# Patient Record
Sex: Female | Born: 2000 | Race: Black or African American | Hispanic: No | Marital: Single | Smoking: Former smoker
Health system: Southern US, Community
[De-identification: ages and names within clinical notes are randomized; demographics above are authoritative.]

## PROBLEM LIST (undated history)

## (undated) DIAGNOSIS — L309 Dermatitis, unspecified: Secondary | ICD-10-CM

## (undated) DIAGNOSIS — J45909 Unspecified asthma, uncomplicated: Secondary | ICD-10-CM

## (undated) DIAGNOSIS — Z8619 Personal history of other infectious and parasitic diseases: Secondary | ICD-10-CM

## (undated) HISTORY — PX: WISDOM TOOTH EXTRACTION: SHX21

## (undated) HISTORY — PX: NO PAST SURGERIES: SHX2092

---
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
## 2020-01-14 ENCOUNTER — Other Ambulatory Visit: Payer: Self-pay

## 2020-01-14 ENCOUNTER — Ambulatory Visit (HOSPITAL_COMMUNITY)
Admission: EM | Admit: 2020-01-14 | Discharge: 2020-01-14 | Disposition: A | Discharge status: Home / Self Care | Payer: Medicaid Other | Attending: Family Medicine | Admitting: Family Medicine

## 2020-01-14 DIAGNOSIS — N760 Acute vaginitis: Secondary | ICD-10-CM | POA: Diagnosis not present

## 2020-01-14 DIAGNOSIS — L309 Dermatitis, unspecified: Secondary | ICD-10-CM | POA: Insufficient documentation

## 2020-01-14 DIAGNOSIS — L209 Atopic dermatitis, unspecified: Secondary | ICD-10-CM | POA: Insufficient documentation

## 2020-01-14 LAB — POCT URINALYSIS DIP (DEVICE)
Bilirubin Urine: NEGATIVE
Glucose, UA: NEGATIVE mg/dL
Hgb urine dipstick: NEGATIVE
Ketones, ur: NEGATIVE mg/dL
Leukocytes,Ua: NEGATIVE
Nitrite: NEGATIVE
Protein, ur: NEGATIVE mg/dL
Specific Gravity, Urine: >=1.03 (ref 1.005–1.030)
Urobilinogen, UA: 0.2 mg/dL (ref 0.0–1.0)
pH: 5.5 (ref 5.0–8.0)

## 2020-01-14 MED ORDER — FLUTICASONE PROPIONATE 0.05 % EX CREA: TOPICAL_CREAM | Freq: Two times a day (BID) | CUTANEOUS | 4 refills | Status: DC

## 2020-01-14 MED ORDER — FLUCONAZOLE 150 MG PO TABS
150.0000 mg | ORAL_TABLET | Freq: Every day | ORAL | 0 refills | Status: DC
Start: 2020-01-14 — End: 2020-12-06

## 2020-01-14 MED ORDER — METRONIDAZOLE 500 MG PO TABS: 500.0000 mg | ORAL_TABLET | Freq: Two times a day (BID) | ORAL | 0 refills | Status: DC

## 2020-01-14 MED ORDER — NYSTATIN-TRIAMCINOLONE 100000-0.1 UNIT/GM-% EX CREA
TOPICAL_CREAM | CUTANEOUS | 0 refills | Status: DC
Start: 2020-01-14 — End: 2020-12-06

## 2020-01-14 NOTE — Discharge Instructions (Addendum)
Treating you for bacterial and yeast infection.  Use the medication as prescribed. Also sent in some cream for your  eczema.  Make sure you are keeping the skin moisturized with emollients over-the-counter You can check your MyChart for your swab results

## 2020-01-14 NOTE — ED Triage Notes (Signed)
Pt presents after using new vaginal soap "honey pot" and now has sores discomfort on labias. Pt denies having reaction to soaps in past. Pt states she tried vaseline without relief.

## 2020-01-14 NOTE — ED Provider Notes (Signed)
[facility]    CSN: [id number] Arrival date & time: 01/14/20  1051      History   Chief Complaint Chief Complaint  Patient presents with  . Allergic Reaction    HPI Gabrielle Garcia is a 19 y.o. female.   Patient is a 19 year old female with past medical history of eczema, asthma.  She presents today for vaginal irritation, swelling and rash.  Symptoms been constant since using a new over-the-counter.  Reporting sores on labia.  She did do baking soda bath which helped her symptoms.  History of BV and yeast.  No abdominal pain, fevers.  Some dysuria due to the irritation.  Some milky white discharge without odor.  No specific itching.  ROS per HPI      Past Medical History:  Diagnosis Date  . Asthma   . Eczema     Patient Active Problem List   Diagnosis Date Noted  . Dry skin 07/21/2019  . Enlarged lymph node 07/21/2019  . Irregular menstruation 04/21/2019  . Seasonal allergic conjunctivitis 02/14/2016  . Asthma, moderate persistent 03/14/2014  . Allergic rhinitis 10/12/2013  . Atopic dermatitis 12/20/2012    Past Surgical History:  Procedure Laterality Date  . NO PAST SURGERIES      OB History    Gravida  0   Para  0   Term  0   Preterm  0   AB  0   Living  0     SAB  0   TAB  0   Ectopic  0   Multiple  0   Live Births  0            Home Medications    Prior to Admission medications   Medication Sig Start Date End Date Taking? Authorizing Provider  RESTASIS 0.05 % ophthalmic emulsion Place 1 drop into both eyes daily.  12/03/17  Yes [provider]  albuterol (VENTOLIN HFA) 108 (90 Base) MCG/ACT inhaler Inhale 2 puffs into the lungs every 4 (four) hours as needed for wheezing or shortness of breath (or cough). 04/18/19 01/14/20 Yes Simha, Shruti V, MD  fluconazole (DIFLUCAN) 150 MG tablet Take 1 tablet (150 mg total) by mouth daily. 01/14/20   Juston Goheen, Tressia Miners A, NP  fluticasone (CUTIVATE) 0.05 % cream Apply  topically 2 (two) times daily. 01/14/20   Loura Halt A, NP  metroNIDAZOLE (FLAGYL) 500 MG tablet Take 1 tablet (500 mg total) by mouth 2 (two) times daily. 01/14/20   Orvan July, NP  nystatin-triamcinolone (MYCOLOG II) cream Apply to affected area daily 01/14/20   Loura Halt A, NP  sodium chloride (OCEAN) 0.65 % SOLN nasal spray Place 1 spray into both nostrils as needed for up to 30 days for congestion. 09/13/18 10/13/18  Alveria Apley, PA-C  cetirizine (ZYRTEC) 10 MG tablet Take 1 tablet (10 mg total) by mouth daily. 04/18/19 01/14/20  Ok Edwards, MD  norgestimate-ethinyl estradiol (ORTHO-CYCLEN,SPRINTEC,PREVIFEM) 0.25-35 MG-MCG tablet Take 1 tablet by mouth daily. Patient not taking: Reported on 07/19/2018 02/08/18 01/14/20  Tresea Mall, CNM    Family History Family History  Problem Relation Age of Onset  . Hypertension Other   . Diabetes Other   . Eczema Mother   . Urticaria Mother   . Asthma Brother   . Allergic rhinitis Neg Hx   . Angioedema Neg Hx   . Atopy Neg Hx   . Immunodeficiency Neg Hx     Social History Social History  Tobacco Use  . Smoking status: Never Smoker  . Smokeless tobacco: Never Used  Vaping Use  . Vaping Use: Never used  Substance Use Topics  . Alcohol use: No  . Drug use: No     Allergies   Pineapple   Review of Systems Review of Systems   Physical Exam Triage Vital Signs ED Triage Vitals [01/14/20 1141]  Enc Vitals Group     BP (!) 148/76     Pulse Rate 78     Resp 18     Temp 98.8 F (37.1 C)     Temp Source Oral     SpO2 100 %     Weight      Height      Head Circumference      Peak Flow      Pain Score 3     Pain Loc      Pain Edu?      Excl. in GC?    No data found.  Updated Vital Signs BP (!) 148/76 (BP Location: Right Arm)   Pulse 78   Temp 98.8 F (37.1 C) (Oral)   Resp 18   LMP 12/24/2019   SpO2 100%   Visual Acuity Right Eye Distance:   Left Eye Distance:   Bilateral Distance:    Right Eye  Near:   Left Eye Near:    Bilateral Near:     Physical Exam Vitals and nursing note reviewed.  Constitutional:      General: She is not in acute distress.    Appearance: Normal appearance. She is not ill-appearing, toxic-appearing or diaphoretic.  HENT:     Head: Normocephalic.     Nose: Nose normal.  Eyes:     Conjunctiva/sclera: Conjunctivae normal.  Pulmonary:     Effort: Pulmonary effort is normal.  Genitourinary:    Comments: Generalized swelling to labia majora with erythema and scattered open sores.  No vesicles or drainage Musculoskeletal:        General: Normal range of motion.     Cervical back: Normal range of motion.  Skin:    General: Skin is warm and dry.     Findings: No rash.  Neurological:     Mental Status: She is alert.  Psychiatric:        Mood and Affect: Mood normal.      UC Treatments / Results  Labs (all labs ordered are listed, but only abnormal results are displayed) Labs Reviewed  POCT URINALYSIS DIP (DEVICE)    EKG   Radiology No results found.  Procedures Procedures (including critical care time)  Medications Ordered in UC Medications - No data to display  Initial Impression / Assessment and Plan / UC Course  I have reviewed the triage vital signs and the nursing notes.  Pertinent labs & imaging results that were available during my care of the patient were reviewed by me and considered in my medical decision making (see chart for details).     Vaginitis Treating for yeast infection and bacterial reaction. Swab sent for testing. No concern for HSV at this time.  Eczema Refilled fluticasone cream Final Clinical Impressions(s) / UC Diagnoses   Final diagnoses:  Vaginitis and vulvovaginitis  Eczema, unspecified type     Discharge Instructions     Treating you for bacterial and yeast infection.  Use the medication as prescribed. Also sent in some cream for your  eczema.  Make sure you are keeping the skin moisturized  with emollients  over-the-counter You can check your MyChart for your swab results    ED Prescriptions    Medication Sig Dispense Auth. Provider   metroNIDAZOLE (FLAGYL) 500 MG tablet Take 1 tablet (500 mg total) by mouth 2 (two) times daily. 14 tablet Xion Debruyne A, NP   fluticasone (CUTIVATE) 0.05 % cream Apply topically 2 (two) times daily. 60 g Emersyn Kotarski A, NP   nystatin-triamcinolone (MYCOLOG II) cream Apply to affected area daily 15 g Carsyn Taubman A, NP   fluconazole (DIFLUCAN) 150 MG tablet Take 1 tablet (150 mg total) by mouth daily. 2 tablet Dahlia Byes A, NP     PDMP not reviewed this encounter.   Janace Aris, NP 01/14/20 1218

## 2020-01-16 LAB — CERVICOVAGINAL ANCILLARY ONLY
Bacterial Vaginitis (gardnerella): POSITIVE — AB
Candida Glabrata: NEGATIVE
Candida Vaginitis: POSITIVE — AB
Chlamydia: NEGATIVE
Comment: NEGATIVE
Comment: NEGATIVE
Comment: NEGATIVE
Comment: NEGATIVE
Comment: NEGATIVE
Comment: NORMAL
Neisseria Gonorrhea: NEGATIVE
Trichomonas: NEGATIVE

## 2020-02-06 ENCOUNTER — Encounter: Payer: Self-pay | Admitting: Pediatrics

## 2020-02-07 NOTE — Telephone Encounter (Signed)
Called patient to get clarification on what cream she needs refills on. She wasn't sure about the cream name, but she said "the one with the strongest dose". Explained to patient that I will route her message to the providers and let her know what I hear from them, otherwise, she will need to be seen and re-evaluate her condition especially since it's not improving. She voiced understanding and agreed.

## 2020-03-18 DIAGNOSIS — H5213 Myopia, bilateral: Secondary | ICD-10-CM | POA: Diagnosis not present

## 2020-11-20 ENCOUNTER — Telehealth: Payer: Self-pay

## 2020-11-20 NOTE — Telephone Encounter (Signed)
Patient lvm to schedule a follow up visit for eczema and allergies. Routed to pod scheduler/front desk.

## 2020-12-06 ENCOUNTER — Other Ambulatory Visit (HOSPITAL_COMMUNITY)
Admission: RE | Admit: 2020-12-06 | Discharge: 2020-12-06 | Disposition: A | Discharge status: Home / Self Care | Payer: Medicaid Other | Source: Ambulatory Visit | Attending: Pediatrics | Admitting: Pediatrics

## 2020-12-06 ENCOUNTER — Other Ambulatory Visit: Payer: Self-pay

## 2020-12-06 ENCOUNTER — Ambulatory Visit (INDEPENDENT_AMBULATORY_CARE_PROVIDER_SITE_OTHER): Payer: Medicaid Other | Admitting: Pediatrics

## 2020-12-06 VITALS — HR 111 | Temp 98.2°F | Wt 120.2 lb

## 2020-12-06 DIAGNOSIS — Z113 Encounter for screening for infections with a predominantly sexual mode of transmission: Secondary | ICD-10-CM | POA: Diagnosis not present

## 2020-12-06 DIAGNOSIS — L209 Atopic dermatitis, unspecified: Secondary | ICD-10-CM

## 2020-12-06 DIAGNOSIS — J302 Other seasonal allergic rhinitis: Secondary | ICD-10-CM | POA: Diagnosis not present

## 2020-12-06 DIAGNOSIS — J4520 Mild intermittent asthma, uncomplicated: Secondary | ICD-10-CM | POA: Diagnosis not present

## 2020-12-06 MED ORDER — TRIAMCINOLONE ACETONIDE 0.1 % EX OINT
1.0000 | TOPICAL_OINTMENT | Freq: Two times a day (BID) | CUTANEOUS | 4 refills | Status: DC
Start: 2020-12-06 — End: 2021-05-22

## 2020-12-06 MED ORDER — FLUTICASONE PROPIONATE 0.05 % EX CREA: TOPICAL_CREAM | Freq: Two times a day (BID) | CUTANEOUS | 4 refills | Status: DC

## 2020-12-06 MED ORDER — ALBUTEROL SULFATE HFA 108 (90 BASE) MCG/ACT IN AERS: 2.0000 | INHALATION_SPRAY | RESPIRATORY_TRACT | 1 refills | Status: DC | PRN

## 2020-12-06 MED ORDER — CETIRIZINE HCL 10 MG PO TABS: 10.0000 mg | ORAL_TABLET | Freq: Every day | ORAL | 11 refills | Status: DC

## 2020-12-06 MED ORDER — OLOPATADINE HCL 0.1 % OP SOLN: 1.0000 [drp] | Freq: Two times a day (BID) | OPHTHALMIC | 3 refills | Status: DC

## 2020-12-06 MED ORDER — HYDROXYZINE HCL 25 MG PO TABS: 25.0000 mg | ORAL_TABLET | Freq: Three times a day (TID) | ORAL | 0 refills | Status: DC | PRN

## 2020-12-06 NOTE — Progress Notes (Signed)
    Subjective:    Gabrielle Garcia is a 20 y.o. female accompanied by self presenting to the clinic today with a chief c/o of flare up of eczema & asthma. Run out of all all topical steroids & allergies. Eczema flare up with significant itching. Also with nasal congestion & eye itching. H/o asthma but no wheezing or cough symptoms. Not used albuterol in several months.  Review of Systems  Constitutional: Negative for activity change, appetite change, fatigue and fever.  HENT: Positive for congestion and sneezing.   Respiratory: Negative for cough, shortness of breath and wheezing.   Gastrointestinal: Negative for abdominal pain, diarrhea, nausea and vomiting.  Genitourinary: Negative for dysuria.  Skin: Positive for rash.  Neurological: Negative for headaches.  Psychiatric/Behavioral: Negative for sleep disturbance.       Objective:   Physical Exam Constitutional:      Appearance: She is well-developed.  HENT:     Right Ear: External ear normal.     Left Ear: External ear normal.     Nose: Nose normal.  Eyes:     Pupils: Pupils are equal, round, and reactive to light.  Cardiovascular:     Rate and Rhythm: Normal rate and regular rhythm.     Heart sounds: Normal heart sounds.  Pulmonary:     Effort: Pulmonary effort is normal.     Breath sounds: Normal breath sounds.  Abdominal:     General: Bowel sounds are normal.     Palpations: Abdomen is soft.  Musculoskeletal:        General: Normal range of motion.     Cervical back: Normal range of motion.  Skin:    Capillary Refill: Capillary refill takes less than 2 seconds.     Findings: Rash present.     Comments: Generalized hyperpigmented eczematous rash, mild scarring on back of neck, shoulders and back.   Neurological:     Mental Status: She is alert.    .Pulse (!) 111   Temp 98.2 F (36.8 C) (Oral)   Wt 120 lb 3.2 oz (54.5 kg)   SpO2 96%   BMI 20.12 kg/m       Assessment & Plan:  1. Atopic  dermatitis, unspecified type Skin care discussed. - fluticasone (CUTIVATE) 0.05 % cream; Apply topically 2 (two) times daily. Apply to affected areas except the face  Dispense: 80 g; Refill: 4 Triamcinolone 0.01% to affected areas as needed. Oral hydroxyzine 25 mg tid prn for 3 to 5 days followed by Cetirizine 20 mg qhs.  2. Intermittent asthma without complication, unspecified asthma severity Refilled meds - albuterol (VENTOLIN HFA) 108 (90 Base) MCG/ACT inhaler; Inhale 2 puffs into the lungs every 4 (four) hours as needed for wheezing or shortness of breath (or cough).  Dispense: 8 g; Refill: 1  3. Seasonal allergies  - cetirizine (ZYRTEC) 10 MG tablet; Take 1 tablet (10 mg total) by mouth daily.  Dispense: 30 tablet; Refill: 11 -Flonase nasal spray.  4. Routine screening for STI (sexually transmitted infection) Patient requested testing. - Urine cytology ancillary only   Return in about 2 months (around 02/05/2021) for Well child with Dr Wynetta Emery.  Tobey Bride, MD 12/06/2020 5:42 PM

## 2020-12-06 NOTE — Patient Instructions (Signed)
Atopic Dermatitis Atopic dermatitis is a skin disorder that causes inflammation of the skin. It is marked by a red rash and itchy, dry, scaly skin. It is the most common type of eczema. Eczema is a group of skin conditions that cause the skin to become rough and swollen. This condition is generally worse during the cooler winter months and often improves during the warm summer months. Atopic dermatitis usually starts showing signs in infancy and can last through adulthood. This condition cannot be passed from one person to another (is not contagious). Atopic dermatitis may not always be present, but when it is, it is called a flare-up. What are the causes? The exact cause of this condition is not known. Flare-ups may be triggered by:  Coming in contact with something that you are sensitive or allergic to (allergen).  Stress.  Certain foods.  Extremely hot or cold weather.  Harsh chemicals and soaps.  Dry air.  Chlorine. What increases the risk? This condition is more likely to develop in people who have a personal or family history of:  Eczema.  Allergies.  Asthma.  Hay fever. What are the signs or symptoms? Symptoms of this condition include:  Dry, scaly skin.  Red, itchy rash.  Itchiness, which can be severe. This may occur before the skin rash. This can make sleeping difficult.  Skin thickening and cracking that can occur over time.   How is this diagnosed? This condition is diagnosed based on:  Your symptoms.  Your medical history.  A physical exam. How is this treated? There is no cure for this condition, but symptoms can usually be controlled. Treatment focuses on:  Controlling the itchiness and scratching. You may be given medicines, such as antihistamines or steroid creams.  Limiting exposure to allergens.  Recognizing situations that cause stress and developing a plan to manage stress. If your atopic dermatitis does not get better with medicines, or if  it is all over your body (widespread), a treatment using a specific type of light (phototherapy) may be used. Follow these instructions at home: Skin care  Keep your skin well moisturized. Doing this seals in moisture and helps to prevent dryness. ? Use unscented lotions that have petroleum in them. ? Avoid lotions that contain alcohol or water. They can dry the skin.  Keep baths or showers short (less than 5 minutes) in warm water. Do not use hot water. ? Use mild, unscented cleansers for bathing. Avoid soap and bubble bath. ? Apply a moisturizer to your skin right after a bath or shower.  Do not apply anything to your skin without checking with your health care provider.   General instructions  Take or apply over-the-counter and prescription medicines only as told by your health care provider.  Dress in clothes made of cotton or cotton blends. Dress lightly because heat increases itchiness.  When washing your clothes, rinse your clothes twice so all of the soap is removed.  Avoid any triggers that can cause a flare-up.  Keep your fingernails cut short.  Avoid scratching. Scratching makes the rash and itchiness worse. A break in the skin from scratching could result in a skin infection (impetigo).  Do not be around people who have cold sores or fever blisters. If you get the infection, it may cause your atopic dermatitis to worsen.  Keep all follow-up visits. This is important. Contact a health care provider if:  Your itchiness interferes with sleep.  Your rash gets worse or is not better within   one week of starting treatment.  You have a fever.  You have a rash flare-up after having contact with someone who has cold sores or fever blisters. Get help right away if:  You develop pus or soft yellow scabs in the rash area. Summary  Atopic dermatitis causes a red rash and itchy, dry, scaly skin.  Treatment focuses on controlling the itchiness and scratching, limiting  exposure to things that you are sensitive or allergic to (allergens), recognizing situations that cause stress, and developing a plan to manage stress.  Keep your skin well moisturized.  Keep baths or showers shorter than 5 minutes and use warm water. Do not use hot water. This information is not intended to replace advice given to you by your health care provider. Make sure you discuss any questions you have with your health care provider. Document Revised: 04/30/2020 Document Reviewed: 04/30/2020 Elsevier Patient Education  2021 Elsevier Inc.  

## 2020-12-07 ENCOUNTER — Encounter: Payer: Self-pay | Admitting: Pediatrics

## 2020-12-07 LAB — URINE CYTOLOGY ANCILLARY ONLY
Chlamydia: NEGATIVE
Comment: NEGATIVE
Comment: NORMAL
Neisseria Gonorrhea: NEGATIVE

## 2021-01-31 ENCOUNTER — Other Ambulatory Visit: Payer: Self-pay | Admitting: Pediatrics

## 2021-02-28 ENCOUNTER — Ambulatory Visit: Payer: Medicaid Other | Admitting: Pediatrics

## 2021-04-15 ENCOUNTER — Ambulatory Visit: Payer: Medicaid Other | Admitting: Pediatrics

## 2021-05-22 ENCOUNTER — Ambulatory Visit (INDEPENDENT_AMBULATORY_CARE_PROVIDER_SITE_OTHER): Payer: Medicaid Other | Admitting: Pediatrics

## 2021-05-22 ENCOUNTER — Other Ambulatory Visit (HOSPITAL_COMMUNITY)
Admission: RE | Admit: 2021-05-22 | Discharge: 2021-05-22 | Disposition: A | Discharge status: Home / Self Care | Payer: Medicaid Other | Source: Ambulatory Visit | Attending: Pediatrics | Admitting: Pediatrics

## 2021-05-22 ENCOUNTER — Other Ambulatory Visit: Payer: Self-pay

## 2021-05-22 ENCOUNTER — Encounter: Payer: Self-pay | Admitting: Pediatrics

## 2021-05-22 VITALS — Temp 98.3°F | Wt 121.2 lb

## 2021-05-22 DIAGNOSIS — J302 Other seasonal allergic rhinitis: Secondary | ICD-10-CM | POA: Diagnosis not present

## 2021-05-22 DIAGNOSIS — N898 Other specified noninflammatory disorders of vagina: Secondary | ICD-10-CM | POA: Insufficient documentation

## 2021-05-22 DIAGNOSIS — Z3202 Encounter for pregnancy test, result negative: Secondary | ICD-10-CM

## 2021-05-22 DIAGNOSIS — J4520 Mild intermittent asthma, uncomplicated: Secondary | ICD-10-CM

## 2021-05-22 DIAGNOSIS — L209 Atopic dermatitis, unspecified: Secondary | ICD-10-CM

## 2021-05-22 LAB — POCT URINE PREGNANCY: Preg Test, Ur: NEGATIVE

## 2021-05-22 MED ORDER — OLOPATADINE HCL 0.1 % OP SOLN: 1.0000 [drp] | Freq: Two times a day (BID) | OPHTHALMIC | 11 refills | Status: DC

## 2021-05-22 MED ORDER — FLUTICASONE PROPIONATE 50 MCG/ACT NA SUSP
2.0000 | Freq: Every day | NASAL | 12 refills | Status: DC
Start: 2021-05-22 — End: 2021-09-04

## 2021-05-22 MED ORDER — PIMECROLIMUS 1 % EX CREA: TOPICAL_CREAM | Freq: Two times a day (BID) | CUTANEOUS | 12 refills | Status: DC

## 2021-05-22 MED ORDER — TRIAMCINOLONE ACETONIDE 0.1 % EX OINT: 1.0000 "application " | TOPICAL_OINTMENT | Freq: Two times a day (BID) | CUTANEOUS | 12 refills | Status: DC

## 2021-05-22 MED ORDER — HYDROXYZINE HCL 25 MG PO TABS: 25.0000 mg | ORAL_TABLET | Freq: Three times a day (TID) | ORAL | 6 refills | Status: DC

## 2021-05-22 MED ORDER — CETIRIZINE HCL 10 MG PO TABS: 10.0000 mg | ORAL_TABLET | Freq: Every day | ORAL | 11 refills | Status: DC

## 2021-05-22 MED ORDER — CLOBETASOL PROPIONATE 0.05 % EX OINT: 1.0000 "application " | TOPICAL_OINTMENT | Freq: Two times a day (BID) | CUTANEOUS | 12 refills | Status: DC

## 2021-05-22 MED ORDER — ALBUTEROL SULFATE HFA 108 (90 BASE) MCG/ACT IN AERS: 2.0000 | INHALATION_SPRAY | RESPIRATORY_TRACT | 3 refills | Status: DC | PRN

## 2021-05-22 NOTE — Progress Notes (Signed)
Subjective:    Gabrielle Garcia is a 20 y.o. female accompanied by  self  presenting to the clinic today for eczema flare up. She needs refill on all eczema & allergy medications. Presently with eczema flare up on arms, legs & trunk. Very pruritic lesions & she uses atarax as needed but it makes her drowsy. She has been using cetirizine upto 30 mg. Also is having flare up of nasal allergies. Asthma is well controlled with no recent albuterol use. No new soaps or detergent use. Patient is currently not in school & is working full time at [employer]. She also requested STI screening  Review of Systems  Constitutional:  Negative for activity change, appetite change, fatigue and fever.  HENT:  Negative for congestion.   Respiratory:  Negative for cough, shortness of breath and wheezing.   Gastrointestinal:  Negative for abdominal pain, diarrhea, nausea and vomiting.  Genitourinary:  Negative for dysuria.  Skin:  Positive for rash.  Neurological:  Negative for headaches.  Psychiatric/Behavioral:  Negative for sleep disturbance.       Objective:   Physical Exam Constitutional:      Appearance: She is well-developed.  HENT:     Right Ear: External ear normal.     Left Ear: External ear normal.     Nose: Nose normal.  Eyes:     Pupils: Pupils are equal, round, and reactive to light.  Cardiovascular:     Rate and Rhythm: Normal rate and regular rhythm.     Heart sounds: Normal heart sounds.  Pulmonary:     Effort: Pulmonary effort is normal.     Breath sounds: Normal breath sounds.  Abdominal:     General: Bowel sounds are normal.     Palpations: Abdomen is soft.  Musculoskeletal:        General: Normal range of motion.     Cervical back: Normal range of motion.  Skin:    Capillary Refill: Capillary refill takes less than 2 seconds.     Findings: Rash present.     Comments: Generalized hyperpigmented eczematous rash, mild scarring on back of neck, shoulders and back.    Neurological:     Mental Status: She is alert.   .Temp 98.3 F (36.8 C) (Temporal)   Wt 121 lb 3.2 oz (55 kg)   BMI 20.29 kg/m       Assessment & Plan:  1. Atopic dermatitis, unspecified type Add Clobetasol & Elidel to skin regimen. - clobetasol ointment (TEMOVATE) 0.05 %; Apply 1 application topically 2 (two) times daily.  Dispense: 60 g; Refill: 12- USE ONLY AS NEEDED with steroid free breaks. - triamcinolone ointment (KENALOG) 0.1 %; Apply 1 application topically 2 (two) times daily.  Dispense: 80 g; Refill: 12 - pimecrolimus (ELIDEL) 1 % cream; Apply topically 2 (two) times daily. Meets PA criteria  Dispense: 100 g; Refill: 12  2. Seasonal allergies Refilled meds - cetirizine (ZYRTEC) 10 MG tablet; Take 1 tablet (10 mg total) by mouth daily.  Dispense: 30 tablet; Refill: 11  3. Intermittent asthma without complication, unspecified asthma severity Refilled meds - albuterol (VENTOLIN HFA) 108 (90 Base) MCG/ACT inhaler; Inhale 2 puffs into the lungs every 4 (four) hours as needed for wheezing or shortness of breath (or cough).  Dispense: 8 g; Refill: 3  4. Vaginal discharge H/o vaginal discharge - POCT urine pregnancy - Urine cytology ancillary only - WET PREP BY MOLECULAR PROBE    Return if symptoms worsen or fail to  improve.  Tobey Bride, MD 05/23/2021 11:13 AM

## 2021-05-22 NOTE — Patient Instructions (Addendum)
To help treat dry skin:  - Use a thick moisturizer such as petroleum jelly, coconut oil, Eucerin, or Aquaphor from face to toes 2 times a day every day.   - Use sensitive skin, moisturizing soaps with no smell (example: Dove, Cetaphil or Ceravue) - Use fragrance free detergent (example: Dreft or another "free and clear" detergent) - Do not use strong soaps or lotions with smells (example: Johnson's lotion or baby wash) - Do not use fabric softener or fabric softener sheets in the laundry.      Adult Primary Care Clinics Name Criteria Services  [facility] Insurance   [facility]  Uninsured  [place] A "medical home" for adults needing healthcare when it's not an emergency. Chronic disease management Disease prevention, diagnosis and treatment Onsite point-of-care laboratory testing. Health education and prevention programs. Physicals and immunizations  [address] [place], [place] [address]  Phone: [phone] Hours: Mon-Fri 9am-6pm Walk-in: Tues 2pm-5pm                 Thurs 8:30am-4:30pm Languages:  Language line available  Serves Adult patients  WALK IN HOURS FOR CLINIC [facility] DISCOUNT): TUESDAYS 2:00PM - 5:00PM and THURSDAYS 8:30AM - 4:30PM  Space is limited, 10 on Tuesday and 20 on Thursday. It's on first come first serve basis  Name Criteria Services  [facility]   [facility]  [address] [place], [place], [place] [address]  Phone: [phone]  Languages:  Access to language line      . Women's Health/ OB Resources  Name Criteria Services  [facility]- Center for [facility] at [facility] Insurance:   [facility]  Obstetrics & gynecology     [facility] [address] [place], [place] [address]  [phone] Languages:   All (phone interpreter available)    Name Criteria Services  [facility] [place]    Hours: Mon 2pm-7pm Tues 9am-5pm Wed & Thurs-  Closed Fri 9am-5pm Sat 9am-1pm Insurance   [address] [place] [employer] ([employer]BS) [employer] [place] Medcost Medicaid [employer] Abortion referral Birth Control General Health Care HIV testing Men's Health Care Morning-after Pill Pregnancy testing & services STD testing, treatment & vaccines [facility] Health care  [address] [place], [place] [address]  Phone: [phone] Fax: [phone] Languages:  English   Name Criteria Services  [facility]   Hours: Monday 9am-5pm Tuesday 10am-6pm Wednesday 9am-5pm Thursday 11am-7pm Friday 9am-1pm Insurance   [employer] [place] [employer] (BCBS) Ambulance person Medcost Medicaid [employer] Abortion services Birth control General health care HIV testing Men's & Women's Health Care Morning-after pill Pregnancy testing & services STD testing, treatment & vaccines  [address] [place], [place] [address]  Phone: [phone] Fax: [phone] Languages:  Interpretation by phone available for other languages   Name Criteria Services  [facility]  Focus: Empowering women & men to face unplanned pregnancy Insurance    Free pregnancy tests, ultrasounds, and STD tests Abortion information (about procedures, risks, alternatives- NOT performing or referring for them) Sexual health education Classes: parenting, abortion recovery, communication in relationships  [address] [place], [place] [address]  Phone: [phone] or [phone]  Hours: Monday, Wednesday, Friday 10am-5pm Tuesday, Thursday 1pm-9pm Languages:  [place]   Environmental manager

## 2021-05-23 ENCOUNTER — Encounter: Payer: Self-pay | Admitting: Pediatrics

## 2021-05-23 ENCOUNTER — Other Ambulatory Visit: Payer: Self-pay | Admitting: Pediatrics

## 2021-05-23 DIAGNOSIS — N760 Acute vaginitis: Secondary | ICD-10-CM

## 2021-05-23 LAB — URINE CYTOLOGY ANCILLARY ONLY
Chlamydia: NEGATIVE
Comment: NEGATIVE
Comment: NORMAL
Neisseria Gonorrhea: NEGATIVE

## 2021-05-23 LAB — WET PREP BY MOLECULAR PROBE
Candida species: NOT DETECTED
MICRO NUMBER:: 12523426
SPECIMEN QUALITY:: ADEQUATE
Trichomonas vaginosis: NOT DETECTED

## 2021-05-23 MED ORDER — METRONIDAZOLE 500 MG PO TABS: 500.0000 mg | ORAL_TABLET | Freq: Two times a day (BID) | ORAL | 0 refills | Status: AC

## 2021-05-23 NOTE — Progress Notes (Signed)
MyChart message sent & meds sent to pharmacy.

## 2021-06-09 ENCOUNTER — Other Ambulatory Visit: Payer: Self-pay

## 2021-06-09 ENCOUNTER — Emergency Department (HOSPITAL_COMMUNITY)
Admission: EM | Admit: 2021-06-09 | Discharge: 2021-06-09 | Disposition: A | Discharge status: Home / Self Care | Payer: Medicaid Other | Attending: Emergency Medicine | Admitting: Emergency Medicine

## 2021-06-09 ENCOUNTER — Encounter (HOSPITAL_COMMUNITY): Payer: Self-pay | Admitting: Emergency Medicine

## 2021-06-09 DIAGNOSIS — Z7951 Long term (current) use of inhaled steroids: Secondary | ICD-10-CM | POA: Diagnosis not present

## 2021-06-09 DIAGNOSIS — N9489 Other specified conditions associated with female genital organs and menstrual cycle: Secondary | ICD-10-CM | POA: Diagnosis not present

## 2021-06-09 DIAGNOSIS — Z3201 Encounter for pregnancy test, result positive: Secondary | ICD-10-CM | POA: Insufficient documentation

## 2021-06-09 DIAGNOSIS — J4540 Moderate persistent asthma, uncomplicated: Secondary | ICD-10-CM | POA: Insufficient documentation

## 2021-06-09 DIAGNOSIS — Z3202 Encounter for pregnancy test, result negative: Secondary | ICD-10-CM | POA: Diagnosis not present

## 2021-06-09 LAB — I-STAT BETA HCG BLOOD, ED (MC, WL, AP ONLY): I-stat hCG, quantitative: >2000 m[IU]/mL — ABNORMAL HIGH (ref <5)

## 2021-06-09 NOTE — ED Triage Notes (Signed)
Patient has had 2 at home tests that have been positive and would like confirmation.

## 2021-06-09 NOTE — Discharge Instructions (Signed)
You were seen in the emergency department and had a positive pregnancy test today.  Please start taking a prenatal vitamin.  Please call OB to schedule a follow-up appointment for soon as possible.  Discuss any medication to take with the pharmacist to ensure that they are safe during pregnancy.  Return to the MAU or emergency department for new or worsening symptoms including but not limited to pelvic/abdominal pain, vaginal bleeding, dizziness, lightheadedness, passing out, vomiting, fever, or any other concerns.

## 2021-06-09 NOTE — ED Provider Notes (Signed)
[facility] DEPT Provider Note   CSN: [id number] Arrival date & time: 06/09/21  0155     History Chief Complaint  Patient presents with   Possible Pregnancy    Gabrielle Garcia is a 20 y.o. female who presents to the emergency department requesting a pregnancy test.  Patient states that her last menstrual period was 05/04/2021, she states that she is 1 week late for her period, she has had at home positive pregnancy test but would like confirmation.  She is overall feeling okay.  No alleviating or grading factors.  She denies fever, nausea, vomiting, abdominal pain, pelvic pain, dysuria, or vaginal bleeding.  HPI     Past Medical History:  Diagnosis Date   Asthma    Eczema     Patient Active Problem List   Diagnosis Date Noted   Dry skin 07/21/2019   Enlarged lymph node 07/21/2019   Irregular menstruation 04/21/2019   Seasonal allergic conjunctivitis 02/14/2016   Asthma, moderate persistent 03/14/2014   Allergic rhinitis 10/12/2013   Atopic dermatitis 12/20/2012    Past Surgical History:  Procedure Laterality Date   NO PAST SURGERIES       OB History     Gravida  1   Para  0   Term  0   Preterm  0   AB  0   Living  0      SAB  0   IAB  0   Ectopic  0   Multiple  0   Live Births  0           Family History  Problem Relation Age of Onset   Hypertension Other    Diabetes Other    Eczema Mother    Urticaria Mother    Asthma Brother    Allergic rhinitis Neg Hx    Angioedema Neg Hx    Atopy Neg Hx    Immunodeficiency Neg Hx     Social History   Tobacco Use   Smoking status: Never   Smokeless tobacco: Never  Vaping Use   Vaping Use: Never used  Substance Use Topics   Alcohol use: No   Drug use: No    Home Medications Prior to Admission medications   Medication Sig Start Date End Date Taking? Authorizing Provider  albuterol (VENTOLIN HFA) 108 (90 Base) MCG/ACT inhaler Inhale 2 puffs into  the lungs every 4 (four) hours as needed for wheezing or shortness of breath (or cough). 05/22/21   Ok Edwards, MD  cetirizine (ZYRTEC) 10 MG tablet Take 1 tablet (10 mg total) by mouth daily. 05/22/21   Ok Edwards, MD  clobetasol ointment (TEMOVATE) AB-123456789 % Apply 1 application topically 2 (two) times daily. 05/22/21   Simha, Jerrel Ivory, MD  fluticasone (CUTIVATE) 0.05 % cream Apply topically 2 (two) times daily. Apply to affected areas except the face 12/06/20   Ok Edwards, MD  fluticasone (FLONASE) 50 MCG/ACT nasal spray Place 2 sprays into both nostrils daily. 05/22/21   Ok Edwards, MD  hydrOXYzine (ATARAX/VISTARIL) 25 MG tablet Take 1 tablet (25 mg total) by mouth 3 (three) times daily. 05/22/21   Simha, Jerrel Ivory, MD  olopatadine (PATANOL) 0.1 % ophthalmic solution Place 1 drop into both eyes 2 (two) times daily. 05/22/21   Simha, Jerrel Ivory, MD  pimecrolimus (ELIDEL) 1 % cream Apply topically 2 (two) times daily. Meets PA criteria 05/22/21   Ok Edwards, MD  triamcinolone ointment (KENALOG) 0.1 %  Apply 1 application topically 2 (two) times daily. 05/22/21   Marijo File, MD  norgestimate-ethinyl estradiol (ORTHO-CYCLEN,SPRINTEC,PREVIFEM) 0.25-35 MG-MCG tablet Take 1 tablet by mouth daily. Patient not taking: Reported on 07/19/2018 02/08/18 01/14/20  Thressa Sheller D, CNM    Allergies    Benadryl [diphenhydramine] and Pineapple  Review of Systems   Review of Systems  Constitutional:  Negative for chills and fever.  Respiratory:  Negative for shortness of breath.   Cardiovascular:  Negative for chest pain.  Gastrointestinal:  Negative for abdominal pain, nausea and vomiting.  Genitourinary:  Negative for dysuria, hematuria, pelvic pain and vaginal bleeding.  All other systems reviewed and are negative.  Physical Exam Updated Vital Signs BP 122/70 (BP Location: Left Arm)   Pulse 92   Temp 98.6 F (37 C) (Oral)   Resp 16   Ht 5\' 4"  (1.626 m)   Wt 54.4 kg   LMP  05/04/2021   SpO2 100%   BMI 20.60 kg/m   Physical Exam Vitals and nursing note reviewed.  Constitutional:      General: She is not in acute distress.    Appearance: She is well-developed. She is not toxic-appearing.  HENT:     Head: Normocephalic and atraumatic.  Eyes:     General:        Right eye: No discharge.        Left eye: No discharge.     Conjunctiva/sclera: Conjunctivae normal.  Cardiovascular:     Rate and Rhythm: Normal rate and regular rhythm.  Pulmonary:     Effort: Pulmonary effort is normal. No respiratory distress.     Breath sounds: Normal breath sounds. No wheezing, rhonchi or rales.  Abdominal:     General: There is no distension.     Palpations: Abdomen is soft.     Tenderness: There is no abdominal tenderness.  Musculoskeletal:     Cervical back: Neck supple.  Skin:    General: Skin is warm and dry.     Findings: No rash.  Neurological:     Mental Status: She is alert.     Comments: Clear speech.   Psychiatric:        Behavior: Behavior normal.    ED Results / Procedures / Treatments   Labs (all labs ordered are listed, but only abnormal results are displayed) Labs Reviewed  I-STAT BETA HCG BLOOD, ED ([facility], [facility], [facility] ONLY) - Abnormal; Notable for the following components:      Result Value   I-stat hCG, quantitative >2,000.0 (*)    All other components within normal limits    EKG None  Radiology No results found.  Procedures Procedures   Medications Ordered in ED Medications - No data to display  ED Course  I have reviewed the triage vital signs and the nursing notes.  Pertinent labs & imaging results that were available during my care of the patient were reviewed by me and considered in my medical decision making (see chart for details).    MDM Rules/Calculators/A&P                           Patient presents to the emergency department requesting a pregnancy test.  She is nontoxic, resting comfortably, her vitals are within  normal limits.  She has no complaints at this time, she specifically denies abdominal/pelvic pain or vaginal bleeding therefore do not feel that emergent ultrasound is necessary at this time.  She additionally denies  vomiting or urinary symptoms.  Her pregnancy test is positive.  Discussed starting prenatal vitamin and OB follow-up. I discussed results, treatment plan, need for follow-up, and return precautions with the patient. Provided opportunity for questions, patient confirmed understanding and is in agreement with plan.    Final Clinical Impression(s) / ED Diagnoses Final diagnoses:  Positive pregnancy test    Rx / DC Orders ED Discharge Orders     None        Amaryllis Dyke, PA-C 06/09/21 0424    Maudie Flakes, MD 06/09/21 [phone]

## 2021-06-25 ENCOUNTER — Telehealth: Payer: Self-pay

## 2021-06-25 NOTE — Telephone Encounter (Signed)
-----   Message from Marijo File, MD sent at 06/25/2021 10:56 AM EST ----- Regarding: OB visit I just saw in appointments that she already has an appt with OB in a few weeks. Please let Gabrielle Garcia know that we will be cancelling her well visit at our clinic as we cannot do well visits for pregnant patients. Please encourage her to take daily prenatal MV. Thank you!

## 2021-06-25 NOTE — Telephone Encounter (Signed)
Called and spoke with Gabrielle Garcia. Advised Gabrielle Garcia her well visit appointment with Dr. Deneise Lever has been cancelled for tomorrow due to her pregnancy and care to be provided by her OB. Gabrielle Garcia is scheduled to see her OB in December. Gabrielle Garcia stated understanding. 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 She has not been working for this reason. Encouraged Gabrielle Garcia to reach out to her OB office to discuss if she will need to refrain from certain activities at work during pregnancy. Gabrielle Garcia states that she had been taking her prenatal vitamins daily but began having diarrhea which she feels was related to the vitamins so she quit taking them last week. Advised Gabrielle Garcia to reach out to her OB office with this issue as well who may have suggestions for which multivitamins she should take. Advised on importance of prenatal vitamins for her infant's development. Gabrielle Garcia will reach out to her OB for her care throughout pregnancy.

## 2021-06-26 ENCOUNTER — Ambulatory Visit: Payer: Medicaid Other | Admitting: Pediatrics

## 2021-07-16 ENCOUNTER — Other Ambulatory Visit: Payer: Self-pay

## 2021-07-16 ENCOUNTER — Telehealth (INDEPENDENT_AMBULATORY_CARE_PROVIDER_SITE_OTHER): Payer: Medicaid Other | Admitting: *Deleted

## 2021-07-16 DIAGNOSIS — Z3490 Encounter for supervision of normal pregnancy, unspecified, unspecified trimester: Secondary | ICD-10-CM | POA: Insufficient documentation

## 2021-07-16 DIAGNOSIS — Z3480 Encounter for supervision of other normal pregnancy, unspecified trimester: Secondary | ICD-10-CM

## 2021-07-16 DIAGNOSIS — J4520 Mild intermittent asthma, uncomplicated: Secondary | ICD-10-CM

## 2021-07-16 DIAGNOSIS — Z3A00 Weeks of gestation of pregnancy not specified: Secondary | ICD-10-CM

## 2021-07-16 DIAGNOSIS — J45909 Unspecified asthma, uncomplicated: Secondary | ICD-10-CM | POA: Insufficient documentation

## 2021-07-16 MED ORDER — BLOOD PRESSURE KIT DEVI: 1.0000 | 0 refills | Status: DC | PRN

## 2021-07-16 NOTE — Progress Notes (Addendum)
New OB Intake  I connected with  Gabrielle Garcia on 07/16/21 at 11:20 by MyChart Video Visit and verified that I am speaking with the correct person using two identifiers. Nurse is located at [facility] and pt is located at her [employer].  I discussed the limitations, risks, security and privacy concerns of performing an evaluation and management service by telephone and the availability of in person appointments. I also discussed with the patient that there may be a patient responsible charge related to this service. The patient expressed understanding and agreed to proceed.  I explained I am completing New OB Intake today. We discussed her EDD of 02/08/22 that is based on LMP of 05/04/21. Pt is G2/P0010. I reviewed her allergies, medications, Medical/Surgical/OB history, and appropriate screenings. I instructed her to stop the dermatology creams she has been taking and discuss with provider first to be sure safe in pregnancy.I informed her of [facility] services. She declines [facility] referral at this time. Based on history, this is a/an  pregnancy uncomplicated .   Patient Active Problem List   Diagnosis Date Noted   Supervision of low-risk pregnancy 07/16/2021   Asthma    Dry skin 07/21/2019   Atopic dermatitis 12/20/2012    Concerns addressed today  Delivery Plans:  Plans to deliver at [facility].   MyChart/Babyscripts MyChart access verified. I explained pt will have some visits in office and some virtually. Babyscripts instructions given and order placed. Patient verifies receipt of registration text/e-mail.   Blood Pressure Cuff  Blood pressure cuff ordered for patient to pick-up from [employer]. Explained after first prenatal appt pt will check weekly and document in Babyscripts.  Weight scale: Patient  does not have weight scale. Weight scale ordered for patient to pick up form [facility].   Anatomy US Explained first scheduled [place] will be around 19 weeks. Anatomy US  will be  scheduled and patient notified by MyChart.  Labs Discussed Avelina Laine genetic screening with patient. Would like both Panorama and Horizon drawn at new OB visit. Routine prenatal labs needed.  Covid Vaccine Patient has not had covid vaccine.   Centering in Pregnancy Candidate?  If yes, offer as possibility. Patient would like to try Centering.   Mother/ Baby Dyad Candidate?    If yes, offer as possibility. Patient interested but no openings for July.   Informed patient of [facility] website  and placed link in her AVS.   Social Determinants of Health Food Insecurity: Patient denies food insecurity. WIC Referral: Patient is interested in referral to [facility].  Transportation: Patient denies transportation needs. Childcare: Discussed no children allowed at ultrasound appointments. Offered childcare services; patient declines childcare services at this time.  Send link to Pregnancy Navigators   Placed OB Box on problem list and updated  First visit review I reviewed new OB appt with pt. I explained she will have a pelvic exam, ob bloodwork with genetic screening. Explained pt will be seen by Dr. Debroah Loop at first visit; encounter routed to appropriate provider. Explained that patient will be seen by pregnancy navigator following visit with provider. [facility] information placed in AVS.   Robertine Kipper,RN 07/16/2021  12:23 PM

## 2021-07-16 NOTE — Patient Instructions (Addendum)
Please do not use any of your creams until verifying if safe in pregnancy including Elevil cream and Cutivate cream.    At our [facility]ices, we work as an integrated team, providing care to address both physical and emotional health. Your medical provider may refer you to see our Behavioral Health Clinician [facility]) on the same day you see your medical provider, as availability permits; often scheduled virtually at your convenience.  Our [facility] is available to all patients, visits generally last between 20-30 minutes, but can be longer or shorter, depending on patient need. The [facility] offers help with stress management, coping with symptoms of depression and anxiety, major life changes , sleep issues, changing risky behavior, grief and loss, life stress, working on personal life goals, and  behavioral health issues, as these all affect your overall health and wellness.  The [facility] is NOT available for the following: FMLA paperwork, court-ordered evaluations, specialty assessments (custody or disability), letters to employers, or obtaining certification for an emotional support animal. The [facility] does not provide long-term therapy. You have the right to refuse integrated behavioral health services, or to reschedule to see the [facility] at a later date.  Confidentiality exception: If it is suspected that a child or disabled adult is being abused or neglected, we are required by law to report that to either Child Protective Services or Adult Management consultant.  If you have a diagnosis of Bipolar affective disorder, Schizophrenia, or recurrent Major depressive disorder, we will recommend that you establish care with a psychiatrist, as these are lifelong, chronic conditions, and we want your overall emotional health and medications to be more closely monitored. If you anticipate needing extended maternity leave due to mental health issues postpartum, it it recommended you inform your medical provider, so we can put in a  referral to a psychiatrist as soon as possible. The [facility] is unable to recommend an extended maternity leave for mental health issues. Your medical provider or [facility] may refer you to a therapist for ongoing, traditional therapy, or to a psychiatrist, for medication management, if it would benefit your overall health. Depending on your insurance, you may have a copay or be charged a deductible, depending on your insurance, to see the [employer] If you are uninsured, it is recommended that you apply for financial assistance. (Forms may be requested at the front desk for in-person visits, via MyChart, or request a form during a virtual visit).  If you see the [facility] more than 6 times, you will have to complete a comprehensive clinical assessment interview with the [facility] to resume integrated services.  For virtual visits with the [facility], you must be physically in the state of [place] at the time of the visit. For example, if you live in [place], you will have to do an in-person visit with the [facility], and your out-of-state insurance may not cover behavioral health services in [place]. If you are going out of the state or country for any reason, the [facility] may see you virtually when you return to [place], but not while you are physically outside of [place].    [email or web address] is a website with information to help you prepare for Labor and delivery, patient information, visitor information and more.     Here is a link to the Pregnancy Navigators . Please Fill out prior to your New OB appointment.   English Link: [email or web address]  Spanish Link: [email or web address]

## 2021-07-25 ENCOUNTER — Encounter (HOSPITAL_COMMUNITY): Payer: Self-pay | Admitting: Emergency Medicine

## 2021-07-25 ENCOUNTER — Other Ambulatory Visit: Payer: Self-pay

## 2021-07-25 ENCOUNTER — Emergency Department (HOSPITAL_COMMUNITY)
Admission: EM | Admit: 2021-07-25 | Discharge: 2021-07-25 | Disposition: A | Discharge status: Home / Self Care | Payer: Medicaid Other | Attending: Emergency Medicine | Admitting: Emergency Medicine

## 2021-07-25 ENCOUNTER — Emergency Department (HOSPITAL_COMMUNITY): Payer: Medicaid Other

## 2021-07-25 DIAGNOSIS — Z87891 Personal history of nicotine dependence: Secondary | ICD-10-CM | POA: Diagnosis not present

## 2021-07-25 DIAGNOSIS — J45909 Unspecified asthma, uncomplicated: Secondary | ICD-10-CM | POA: Diagnosis not present

## 2021-07-25 DIAGNOSIS — Z3A12 12 weeks gestation of pregnancy: Secondary | ICD-10-CM | POA: Insufficient documentation

## 2021-07-25 DIAGNOSIS — O2341 Unspecified infection of urinary tract in pregnancy, first trimester: Secondary | ICD-10-CM | POA: Diagnosis not present

## 2021-07-25 DIAGNOSIS — B9689 Other specified bacterial agents as the cause of diseases classified elsewhere: Secondary | ICD-10-CM | POA: Diagnosis not present

## 2021-07-25 DIAGNOSIS — O208 Other hemorrhage in early pregnancy: Secondary | ICD-10-CM | POA: Diagnosis not present

## 2021-07-25 LAB — URINALYSIS, ROUTINE W REFLEX MICROSCOPIC
Bilirubin Urine: NEGATIVE
Glucose, UA: NEGATIVE mg/dL
Hgb urine dipstick: NEGATIVE
Ketones, ur: NEGATIVE mg/dL
Nitrite: NEGATIVE
Protein, ur: NEGATIVE mg/dL
Specific Gravity, Urine: >=1.03 (ref 1.005–1.030)
pH: 6 (ref 5.0–8.0)

## 2021-07-25 LAB — CBC WITH DIFFERENTIAL/PLATELET
Abs Immature Granulocytes: 0.02 10*3/uL (ref 0.00–0.07)
Basophils Absolute: 0 10*3/uL (ref 0.0–0.1)
Basophils Relative: 0 %
Eosinophils Absolute: 0.2 10*3/uL (ref 0.0–0.5)
Eosinophils Relative: 2 %
HCT: 34.6 % — ABNORMAL LOW (ref 36.0–46.0)
Hemoglobin: 11.3 g/dL — ABNORMAL LOW (ref 12.0–15.0)
Immature Granulocytes: 0 %
Lymphocytes Relative: 24 %
Lymphs Abs: 2.2 10*3/uL (ref 0.7–4.0)
MCH: 27.8 pg (ref 26.0–34.0)
MCHC: 32.7 g/dL (ref 30.0–36.0)
MCV: 85.2 fL (ref 80.0–100.0)
Monocytes Absolute: 0.4 10*3/uL (ref 0.1–1.0)
Monocytes Relative: 4 %
Neutro Abs: 6.5 10*3/uL (ref 1.7–7.7)
Neutrophils Relative %: 70 %
Platelets: 286 10*3/uL (ref 150–400)
RBC: 4.06 MIL/uL (ref 3.87–5.11)
RDW: 13.2 % (ref 11.5–15.5)
WBC: 9.4 10*3/uL (ref 4.0–10.5)
nRBC: 0 % (ref 0.0–0.2)

## 2021-07-25 LAB — COMPREHENSIVE METABOLIC PANEL
ALT: 9 U/L (ref 0–44)
AST: 13 U/L — ABNORMAL LOW (ref 15–41)
Albumin: 4.1 g/dL (ref 3.5–5.0)
Alkaline Phosphatase: 28 U/L — ABNORMAL LOW (ref 38–126)
Anion gap: 5 (ref 5–15)
BUN: 16 mg/dL (ref 6–20)
CO2: 22 mmol/L (ref 22–32)
Calcium: 8.8 mg/dL — ABNORMAL LOW (ref 8.9–10.3)
Chloride: 105 mmol/L (ref 98–111)
Creatinine, Ser: 0.54 mg/dL (ref 0.44–1.00)
GFR, Estimated: >60 mL/min (ref >60)
Glucose, Bld: 82 mg/dL (ref 70–99)
Potassium: 3.2 mmol/L — ABNORMAL LOW (ref 3.5–5.1)
Sodium: 132 mmol/L — ABNORMAL LOW (ref 135–145)
Total Bilirubin: 0.3 mg/dL (ref 0.3–1.2)
Total Protein: 7.4 g/dL (ref 6.5–8.1)

## 2021-07-25 LAB — LIPASE, BLOOD: Lipase: 25 U/L (ref 11–51)

## 2021-07-25 LAB — PREGNANCY, URINE: Preg Test, Ur: POSITIVE — AB

## 2021-07-25 MED ORDER — CEPHALEXIN 500 MG PO CAPS
500.0000 mg | ORAL_CAPSULE | Freq: Four times a day (QID) | ORAL | 0 refills | Status: DC
Start: 2021-07-25 — End: 2021-08-20

## 2021-07-25 NOTE — ED Provider Notes (Signed)
Emergency Medicine Provider Triage Evaluation Note  Gabrielle Garcia , a 20 y.o. female  was evaluated in triage.  Pt complains of diarrhea.  Patient states that 5 days ago she began having nonbloody diarrhea up to 4 times a day.  She states that this lasted for 3 days and for 2 days she has had no diarrhea.  She states that she ate some noodles on the day that she began having diarrhea and wonders if this is the cause.  She states that she previously had diarrhea when she was starting prenatals when she found out she was pregnant.  She states that she was having profuse diarrhea with the prenatal vitamins and stopped taking them.  She states that she is also had some abdominal cramping.  She is, she thinks, [redacted] weeks pregnant.  She has not seen OB/GYN yet.  She states that she has her first appointment on 07/31/2021 with [facility].  This is her first pregnancy.  She denies any vaginal bleeding or new vaginal discharge.  Urinary symptoms.  Denies fevers.  Review of Systems  Positive: See above Negative:   Physical Exam  BP 115/81 (BP Location: Left Arm)    Pulse 93    Temp 97.8 F (36.6 C) (Oral)    Resp 18    Ht 5\' 4"  (1.626 m)    Wt 55 kg    LMP 05/04/2021    SpO2 100%    BMI 20.81 kg/m  Gen:   Awake, no distress   Resp:  Normal effort  MSK:   Moves extremities without difficulty  Other:  Abdomen is rounded, soft and nontender.  Fundus not appreciated given estimated gestational age.  Bowel sounds present.  Medical Decision Making  Medically screening exam initiated at 5:19 PM.  Appropriate orders placed.  Anetra 07/04/2021 was informed that the remainder of the evaluation will be completed by another provider, this initial triage assessment does not replace that evaluation, and the importance of remaining in the ED until their evaluation is complete.     [employer], PA-C 07/25/21 1722    07/27/21, MD 08/07/21 1534

## 2021-07-25 NOTE — Discharge Instructions (Addendum)
You were seen in the emergency department today for diarrhea and abdominal cramping.  As we discussed your lab work has looked reassuring.  Your ultrasound shows pregnancy of about [redacted] weeks gestation, with what is called a small subchorionic hemorrhage.  This is not an emergent condition, and I'd like you to discuss this further with your OB/GYN at next week's appointment.  Your urine did show signs of urinary tract infection, and we will treat this with antibiotics.  Which you will take 1 capsule by mouth 4 times daily for the next 5 days.  Continue to monitor how you're doing and return to the ER for new or worsening symptoms such as worsening abdominal cramping, vaginal bleeding or pelvic pain.   It has been a pleasure seeing and caring for you today and I hope you start feeling better soon!

## 2021-07-25 NOTE — ED Provider Notes (Signed)
[place] DEPT Provider Note   CSN: [id number] Arrival date & time: 07/25/21  1654     History No chief complaint on file.   Gabrielle Garcia is a 20 y.o. female at approximately [redacted] weeks gestation who presents to the emergency department complaining of diarrhea and abdominal cramping.  Patient states she was having approximately 4 episodes of diarrhea per day for 3 days, and then had 2 days of no symptoms.  Patient states that she has previously had diarrhea when she started taking prenatals when she found out that she was pregnant, with that she stopped the prenatal vitamins.  She also has having some intermittent abdominal cramping, none today.  Patient states she has not yet seen an OB/GYN, she has her first appointment scheduled for next week.  This is her first pregnancy.  She denies vaginal bleeding, vaginal discharge, pelvic pain, urinary symptoms, or fever.  HPI     Past Medical History:  Diagnosis Date   Asthma    Eczema     Patient Active Problem List   Diagnosis Date Noted   Supervision of low-risk pregnancy 07/16/2021   Asthma    Dry skin 07/21/2019   Atopic dermatitis 12/20/2012    Past Surgical History:  Procedure Laterality Date   NO PAST SURGERIES     WISDOM TOOTH EXTRACTION       OB History     Gravida  2   Para  0   Term  0   Preterm  0   AB  1   Living  0      SAB  1   IAB  0   Ectopic  0   Multiple  0   Live Births  0           Family History  Problem Relation Age of Onset   Hypertension Mother    Eczema Mother    Urticaria Mother    Hypertension Father    Asthma Brother    Hypertension Other    Diabetes Other    Allergic rhinitis Neg Hx    Angioedema Neg Hx    Atopy Neg Hx    Immunodeficiency Neg Hx     Social History   Tobacco Use   Smoking status: Former    Types: Cigars   Smokeless tobacco: Never  Vaping Use   Vaping Use: Never used  Substance Use Topics    Alcohol use: No   Drug use: No    Home Medications Prior to Admission medications   Medication Sig Start Date End Date Taking? Authorizing Provider  cephALEXin (KEFLEX) 500 MG capsule Take 1 capsule (500 mg total) by mouth 4 (four) times daily. 07/25/21  Yes Mohammed Mcandrew T, PA-C  albuterol (VENTOLIN HFA) 108 (90 Base) MCG/ACT inhaler Inhale 2 puffs into the lungs every 4 (four) hours as needed for wheezing or shortness of breath (or cough). 05/22/21   Ok Edwards, MD  Blood Pressure Monitoring (BLOOD PRESSURE KIT) DEVI 1 Device by Does not apply route as needed. 07/16/21   Woodroe Mode, MD  cetirizine (ZYRTEC) 10 MG tablet Take 1 tablet (10 mg total) by mouth daily. 05/22/21   Ok Edwards, MD  clobetasol ointment (TEMOVATE) 4.19 % Apply 1 application topically 2 (two) times daily. 05/22/21   Simha, Jerrel Ivory, MD  fluticasone (CUTIVATE) 0.05 % cream Apply topically 2 (two) times daily. Apply to affected areas except the face 12/06/20   Ok Edwards,  MD  fluticasone (FLONASE) 50 MCG/ACT nasal spray Place 2 sprays into both nostrils daily. 05/22/21   Ok Edwards, MD  hydrOXYzine (ATARAX/VISTARIL) 25 MG tablet Take 1 tablet (25 mg total) by mouth 3 (three) times daily. Patient not taking: Reported on 07/16/2021 05/22/21   Ok Edwards, MD  olopatadine (PATANOL) 0.1 % ophthalmic solution Place 1 drop into both eyes 2 (two) times daily. 05/22/21   Simha, Jerrel Ivory, MD  pimecrolimus (ELIDEL) 1 % cream Apply topically 2 (two) times daily. Meets PA criteria 05/22/21   Ok Edwards, MD  triamcinolone ointment (KENALOG) 0.1 % Apply 1 application topically 2 (two) times daily. 05/22/21   Ok Edwards, MD  norgestimate-ethinyl estradiol (ORTHO-CYCLEN,SPRINTEC,PREVIFEM) 0.25-35 MG-MCG tablet Take 1 tablet by mouth daily. Patient not taking: Reported on 07/19/2018 02/08/18 01/14/20  Marcille Buffy D, CNM    Allergies    Benadryl [diphenhydramine] and Pineapple  Review of Systems    Review of Systems  Constitutional:  Negative for chills and fever.  Gastrointestinal:  Positive for abdominal pain and diarrhea. Negative for blood in stool, constipation, nausea and vomiting.  Genitourinary:  Negative for dysuria, flank pain, frequency, hematuria, pelvic pain, urgency, vaginal bleeding and vaginal discharge.  All other systems reviewed and are negative.  Physical Exam Updated Vital Signs BP 111/72    Pulse 88    Temp 98.8 F (37.1 C) (Oral)    Resp 18    Ht _0  (1.626 m)    Wt 55 kg    LMP 05/04/2021    SpO2 100%    BMI 20.81 kg/m   Physical Exam Vitals and nursing note reviewed.  Constitutional:      Appearance: Normal appearance.  HENT:     Head: Normocephalic and atraumatic.  Eyes:     Conjunctiva/sclera: Conjunctivae normal.  Cardiovascular:     Rate and Rhythm: Normal rate and regular rhythm.  Pulmonary:     Effort: Pulmonary effort is normal. No respiratory distress.     Breath sounds: Normal breath sounds.  Abdominal:     General: There is no distension.     Palpations: Abdomen is soft.     Tenderness: There is no abdominal tenderness.  Skin:    General: Skin is warm and dry.  Neurological:     General: No focal deficit present.     Mental Status: She is alert.    ED Results / Procedures / Treatments   Labs (all labs ordered are listed, but only abnormal results are displayed) Labs Reviewed  PREGNANCY, URINE - Abnormal; Notable for the following components:      Result Value   Preg Test, Ur POSITIVE (*)    All other components within normal limits  URINALYSIS, ROUTINE W REFLEX MICROSCOPIC - Abnormal; Notable for the following components:   APPearance HAZY (*)    Leukocytes,Ua TRACE (*)    Bacteria, UA FEW (*)    All other components within normal limits  COMPREHENSIVE METABOLIC PANEL - Abnormal; Notable for the following components:   Sodium 132 (*)    Potassium 3.2 (*)    Calcium 8.8 (*)    AST 13 (*)    Alkaline Phosphatase 28 (*)     All other components within normal limits  CBC WITH DIFFERENTIAL/PLATELET - Abnormal; Notable for the following components:   Hemoglobin 11.3 (*)    HCT 34.6 (*)    All other components within normal limits  LIPASE, BLOOD    EKG None  Radiology US OB Comp < 14 Wks  Result Date: 07/25/2021 CLINICAL DATA:  Abdominal cramping. EXAM: OBSTETRIC <14 WK ULTRASOUND TECHNIQUE: Transabdominal ultrasound was performed for evaluation of the gestation as well as the maternal uterus and adnexal regions. COMPARISON:  None. FINDINGS: Intrauterine gestational sac: Single Yolk sac:  Not Visualized. Embryo:  Visualized. Cardiac Activity: Visualized. Heart Rate: 162 bpm CRL:   61.5 mm   12 w 4 d                  [place] EDC: 02/02/2022 Subchorionic hemorrhage:  Small subchorionic hemorrhage visualized. Maternal uterus/adnexae: Bilateral maternal ovaries are within normal limits. No free fluid identified. IMPRESSION: 1. Single live intrauterine gestation measuring 12 weeks 4 days by crown-rump length. 2. Small subchorionic hemorrhage. Electronically Signed   By: Ronney Asters M.D.   On: 07/25/2021 18:58    Procedures Procedures   Medications Ordered in ED Medications - No data to display  ED Course  I have reviewed the triage vital signs and the nursing notes.  Pertinent labs & imaging results that were available during my care of the patient were reviewed by me and considered in my medical decision making (see chart for details).    MDM Rules/Calculators/A&P                         Patient is a 20 year old female at approximately [redacted] weeks gestation who presents the emergency department complaining of 3 days of nonbloody diarrhea, and abdominal cramping.  No fevers, chills, nausea, vomiting, dysuria, vaginal bleeding or pelvic pain.  On my exam patient is afebrile, not tachycardic, no acute distress.  Abdomen is soft, with no tenderness to palpation.  Lab work otherwise unremarkable, urinalysis without  evidence of urinary tract infection.  Ultrasound showed single intrauterine pregnancy at 12 weeks and 4 days gestation, with small subchorionic hemorrhage.  Discussed results with patient, and need for treatment of asymptomatic bacteriuria in a pregnant patient.  Overall she is clinically well-appearing, and I do not believe she is not requiring admission or inpatient treatment for her symptoms at this time.  Plan to discharge home with antibiotics, and encourage follow-up at her OB/GYN next week.  Discussed reasons to return to the emergency department, patient agreeable plan.  Final Clinical Impression(s) / ED Diagnoses Final diagnoses:  Urinary tract infection in mother during first trimester of pregnancy    Rx / DC Orders ED Discharge Orders          Ordered    cephALEXin (KEFLEX) 500 MG capsule  4 times daily        07/25/21 1923           Portions of this report may have been transcribed using voice recognition software. Every effort was made to ensure accuracy; however, inadvertent computerized transcription errors may be present.    Estill Cotta 07/25/21 1953    Daleen Bo, MD 07/25/21 2330

## 2021-07-25 NOTE — ED Triage Notes (Signed)
Complains of abdominal cramping and diarrhea for the past 3 days, would like to make sure her baby is okay, EDD July 8th. Denies vaginal bleeding. Last BM was diarrhea, 2 days ago. Reports occ. nausea, denies emesis.

## 2021-07-31 ENCOUNTER — Other Ambulatory Visit: Payer: Self-pay

## 2021-07-31 ENCOUNTER — Other Ambulatory Visit (HOSPITAL_COMMUNITY)
Admission: RE | Admit: 2021-07-31 | Discharge: 2021-07-31 | Disposition: A | Discharge status: Home / Self Care | Payer: Medicaid Other | Source: Ambulatory Visit | Attending: Obstetrics & Gynecology | Admitting: Obstetrics & Gynecology

## 2021-07-31 ENCOUNTER — Ambulatory Visit (INDEPENDENT_AMBULATORY_CARE_PROVIDER_SITE_OTHER): Payer: Medicaid Other | Admitting: Obstetrics & Gynecology

## 2021-07-31 ENCOUNTER — Encounter: Payer: Self-pay | Admitting: Obstetrics & Gynecology

## 2021-07-31 VITALS — BP 103/82 | HR 98 | Wt 124.0 lb

## 2021-07-31 DIAGNOSIS — J4520 Mild intermittent asthma, uncomplicated: Secondary | ICD-10-CM | POA: Diagnosis not present

## 2021-07-31 DIAGNOSIS — Z3491 Encounter for supervision of normal pregnancy, unspecified, first trimester: Secondary | ICD-10-CM

## 2021-07-31 MED ORDER — PRENATAL 28-0.8 MG PO TABS: 1.0000 | ORAL_TABLET | Freq: Every day | ORAL | 12 refills | Status: DC

## 2021-07-31 NOTE — Progress Notes (Signed)
°  Subjective:    Gabrielle Garcia is a G2P0010 [redacted]w[redacted]d being seen today for her first obstetrical visit.  Her obstetrical history is significant for  eczema . Patient does intend to breast feed. Pregnancy history fully reviewed.  Patient reports  she had diarrhea with resolved, asks for different PNV .  Vitals:   07/31/21 1028  BP: 103/82  Pulse: 98  Weight: 124 lb (56.2 kg)    HISTORY: OB History  Gravida Para Term Preterm AB Living  2 0 0 0 1 0  SAB IAB Ectopic Multiple Live Births  1 0 0 0 0    # Outcome Date GA Lbr Len/2nd Weight Sex Delivery Anes PTL Lv  2 Current           1 SAB 2018 [redacted]w[redacted]d            Birth Comments: states had home pregnancy test that was positive , had alot of bleeding, did not go to MD   Past Medical History:  Diagnosis Date   Asthma    Eczema    Past Surgical History:  Procedure Laterality Date   NO PAST SURGERIES     WISDOM TOOTH EXTRACTION     Family History  Problem Relation Age of Onset   Hypertension Mother    Eczema Mother    Urticaria Mother    Hypertension Father    Asthma Brother    Hypertension Other    Diabetes Other    Allergic rhinitis Neg Hx    Angioedema Neg Hx    Atopy Neg Hx    Immunodeficiency Neg Hx      Exam    Uterus:   13 weeks  Pelvic Exam:    Perineum: Not examined   Vulva: deferred   Vagina:     pH:    Cervix:    Adnexa:    Bony Pelvis:   System: Breast:  normal appearance, no masses or tenderness   Skin: normal coloration and turgor, no rashes    Neurologic: oriented, normal mood   Extremities: normal strength, tone, and muscle mass   HEENT PERRLA   Mouth/Teeth mucous membranes moist, pharynx normal without lesions   Neck supple   Cardiovascular: regular rate and rhythm   Respiratory:  appears well, vitals normal, no respiratory distress, acyanotic, normal RR   Abdomen: soft, non-tender; bowel sounds normal; no masses,  no organomegaly   Urinary:       Assessment:    Pregnancy:  G2P0010 Patient Active Problem List   Diagnosis Date Noted   Supervision of low-risk pregnancy 07/16/2021   Asthma    Dry skin 07/21/2019   Atopic dermatitis 12/20/2012        Plan:     Initial labs drawn. Prenatal vitamins. Problem list reviewed and updated. Genetic Screening discussed : ordered.  Ultrasound discussed; fetal survey: ordered.  Follow up in 4 weeks. 50% of 30 min visit spent on counseling and coordination of care.  May work.Janey Greaser ok for eczema   Scheryl Darter 07/31/2021

## 2021-08-01 ENCOUNTER — Encounter: Payer: Self-pay | Admitting: Obstetrics & Gynecology

## 2021-08-01 LAB — HCV INTERPRETATION

## 2021-08-01 LAB — CBC/D/PLT+RPR+RH+ABO+RUBIGG...
Antibody Screen: NEGATIVE
Basophils Absolute: 0 10*3/uL (ref 0.0–0.2)
Basos: 0 %
EOS (ABSOLUTE): 0.2 10*3/uL (ref 0.0–0.4)
Eos: 2 %
HCV Ab: <0.1 s/co ratio (ref 0.0–0.9)
HIV Screen 4th Generation wRfx: NONREACTIVE
Hematocrit: 35.7 % (ref 34.0–46.6)
Hemoglobin: 11.8 g/dL (ref 11.1–15.9)
Hepatitis B Surface Ag: NEGATIVE
Immature Grans (Abs): 0 10*3/uL (ref 0.0–0.1)
Immature Granulocytes: 0 %
Lymphocytes Absolute: 3 10*3/uL (ref 0.7–3.1)
Lymphs: 30 %
MCH: 28.1 pg (ref 26.6–33.0)
MCHC: 33.1 g/dL (ref 31.5–35.7)
MCV: 85 fL (ref 79–97)
Monocytes Absolute: 0.5 10*3/uL (ref 0.1–0.9)
Monocytes: 4 %
Neutrophils Absolute: 6.4 10*3/uL (ref 1.4–7.0)
Neutrophils: 64 %
Platelets: 303 10*3/uL (ref 150–450)
RBC: 4.2 x10E6/uL (ref 3.77–5.28)
RDW: 13.5 % (ref 11.7–15.4)
RPR Ser Ql: NONREACTIVE
Rh Factor: POSITIVE
Rubella Antibodies, IGG: 3.64 index (ref >0.99)
WBC: 10.1 10*3/uL (ref 3.4–10.8)

## 2021-08-01 LAB — CERVICOVAGINAL ANCILLARY ONLY
Bacterial Vaginitis (gardnerella): NEGATIVE
Candida Glabrata: NEGATIVE
Candida Vaginitis: POSITIVE — AB
Chlamydia: NEGATIVE
Comment: NEGATIVE
Comment: NEGATIVE
Comment: NEGATIVE
Comment: NEGATIVE
Comment: NEGATIVE
Comment: NORMAL
Neisseria Gonorrhea: NEGATIVE
Trichomonas: POSITIVE — AB

## 2021-08-01 LAB — HEMOGLOBIN A1C
Est. average glucose Bld gHb Est-mCnc: 103 mg/dL
Hgb A1c MFr Bld: 5.2 % (ref 4.8–5.6)

## 2021-08-02 ENCOUNTER — Other Ambulatory Visit: Payer: Self-pay | Admitting: Lactation Services

## 2021-08-02 ENCOUNTER — Encounter: Payer: Self-pay | Admitting: Pediatrics

## 2021-08-02 MED ORDER — METRONIDAZOLE 500 MG PO TABS: 500.0000 mg | ORAL_TABLET | Freq: Two times a day (BID) | ORAL | 0 refills | Status: DC

## 2021-08-02 MED ORDER — TERCONAZOLE 0.4 % VA CREA: 1.0000 | TOPICAL_CREAM | Freq: Every day | VAGINAL | 0 refills | Status: DC

## 2021-08-04 NOTE — L&D Delivery Note (Addendum)
Delivery Note Gabrielle Garcia is a 21 y.o. G2P0010 at [redacted]w[redacted]d admitted for IOL due to NRNST at term.   GBS Status: Negative/-- (06/14 1443) Maximum Maternal Temperature: 98.4  Labor course: Initial SVE: 1.5/20/-3. Augmentation with: Pitocin and Cytotec. She then progressed to complete.  ROM: 16h 67m with clear fluid  Birth: At 0233 a viable female was delivered via spontaneous vaginal delivery (Presentation: LOA). Nuchal cord present: No.  Shoulders and body delivered in usual fashion. Infant placed directly on mom's abdomen for bonding/skin-to-skin, baby dried and stimulated. Cord clamped x 2 after 1 minute and cut by FOB.  Cord blood collected.  The placenta separated spontaneously and delivered via gentle cord traction.  Pitocin infused rapidly IV per protocol.  Fundus firm with massage.   Placenta inspected and appeared to be intact with a 3 VC.  Placenta/Cord with the following complications: None.   Perineum, vagina, and cervix inspected. Patient was found to have superficial bilateral labial abrasions that were hemostatic and did not require repair.    Sponge and instrument count were correct x2.  Anesthesia:  Epidural Episiotomy: No Lacerations:  Bilateral labial abrasions  Suture Repair:  n/a EBL (mL): 10  Infant: APGAR (1 MIN): 9   APGAR (5 MINS): 9   APGAR (10 MINS):    Infant weight: 3610 g  Mom to postpartum.  Baby to Couplet care / Skin to Skin. Placenta to L&D.  Raylene Everts, MD 02/15/2022 3:00 AM  GME ATTESTATION:  I saw and evaluated the patient. I was gloved and present for the entire delivery and management of the patient. I agree with the findings and the plan of care as documented in the resident's note. I have made changes to documentation as necessary.  Evalina Field, MD OB Fellow, Faculty [facility], Center for [facility] 02/15/2022 4:23 AM

## 2021-08-05 LAB — CULTURE, OB URINE

## 2021-08-05 LAB — URINE CULTURE, OB REFLEX

## 2021-08-07 ENCOUNTER — Telehealth: Payer: Self-pay | Admitting: *Deleted

## 2021-08-07 NOTE — Progress Notes (Signed)
Patient was assessed and managed by nursing staff during this encounter. I have reviewed the chart and agree with the documentation and plan. I have also made any necessary editorial changes. ° °Savana Spina, MD °08/07/2021 10:44 AM  ° °

## 2021-08-07 NOTE — Telephone Encounter (Signed)
I called Gabrielle Garcia to remind her of her first [facility] appt and information. I left a message I was calling about her appointment and will send a MyChart message. Kester Stimpson,RN

## 2021-08-15 ENCOUNTER — Telehealth: Payer: Self-pay

## 2021-08-15 DIAGNOSIS — D563 Thalassemia minor: Secondary | ICD-10-CM | POA: Insufficient documentation

## 2021-08-15 NOTE — Telephone Encounter (Signed)
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

## 2021-08-16 ENCOUNTER — Emergency Department (HOSPITAL_COMMUNITY)
Admission: EM | Admit: 2021-08-16 | Discharge: 2021-08-16 | Disposition: A | Discharge status: Home / Self Care | Payer: Medicaid Other | Attending: Emergency Medicine | Admitting: Emergency Medicine

## 2021-08-16 ENCOUNTER — Other Ambulatory Visit: Payer: Self-pay

## 2021-08-16 DIAGNOSIS — N898 Other specified noninflammatory disorders of vagina: Secondary | ICD-10-CM | POA: Diagnosis not present

## 2021-08-16 DIAGNOSIS — R109 Unspecified abdominal pain: Secondary | ICD-10-CM | POA: Insufficient documentation

## 2021-08-16 DIAGNOSIS — Z3A15 15 weeks gestation of pregnancy: Secondary | ICD-10-CM | POA: Insufficient documentation

## 2021-08-16 DIAGNOSIS — O26892 Other specified pregnancy related conditions, second trimester: Secondary | ICD-10-CM | POA: Insufficient documentation

## 2021-08-16 NOTE — ED Triage Notes (Signed)
Pt c/o abdominal cramping and vaginal discharge x 1 week. Pt report she was prescribe with metronidazole for vaginal discharge 2 weeks ago. Pt report [redacted] weeks pregnant. Pt denies fever, N/V/D.

## 2021-08-20 ENCOUNTER — Other Ambulatory Visit: Payer: Self-pay

## 2021-08-20 ENCOUNTER — Encounter (HOSPITAL_COMMUNITY): Payer: Self-pay | Admitting: Emergency Medicine

## 2021-08-20 ENCOUNTER — Inpatient Hospital Stay (HOSPITAL_COMMUNITY)
Admission: EM | Admit: 2021-08-20 | Discharge: 2021-08-20 | Disposition: A | Discharge status: Home / Self Care | Payer: Medicaid Other | Attending: Obstetrics and Gynecology | Admitting: Obstetrics and Gynecology

## 2021-08-20 DIAGNOSIS — Z3A15 15 weeks gestation of pregnancy: Secondary | ICD-10-CM | POA: Diagnosis not present

## 2021-08-20 DIAGNOSIS — O26892 Other specified pregnancy related conditions, second trimester: Secondary | ICD-10-CM | POA: Diagnosis present

## 2021-08-20 DIAGNOSIS — D563 Thalassemia minor: Secondary | ICD-10-CM

## 2021-08-20 DIAGNOSIS — Z3492 Encounter for supervision of normal pregnancy, unspecified, second trimester: Secondary | ICD-10-CM

## 2021-08-20 DIAGNOSIS — N898 Other specified noninflammatory disorders of vagina: Secondary | ICD-10-CM | POA: Diagnosis not present

## 2021-08-20 DIAGNOSIS — Z8619 Personal history of other infectious and parasitic diseases: Secondary | ICD-10-CM | POA: Diagnosis not present

## 2021-08-20 DIAGNOSIS — J4520 Mild intermittent asthma, uncomplicated: Secondary | ICD-10-CM

## 2021-08-20 LAB — WET PREP, GENITAL
Clue Cells Wet Prep HPF POC: NONE SEEN
Sperm: NONE SEEN
Trich, Wet Prep: NONE SEEN
WBC, Wet Prep HPF POC: <10 (ref <10)
Yeast Wet Prep HPF POC: NONE SEEN

## 2021-08-20 MED ORDER — METRONIDAZOLE 500 MG PO TABS: 2000.0000 mg | ORAL_TABLET | Freq: Once | ORAL | 0 refills | Status: AC

## 2021-08-20 NOTE — ED Triage Notes (Signed)
Pt reports vaginal D/C that started 1 week ago. Pt also reports she is [redacted] weeks pregnant. Denies pain at this time.

## 2021-08-20 NOTE — MAU Note (Signed)
Been having some odd d/c, there is a lot of it, it is yellow, little irritation.  Was dx and treated with trich.  She and partner took the meds, but did not wait to resume relations.

## 2021-08-20 NOTE — MAU Note (Addendum)
Donette Larry CNM in Family Rm to discuss test results and plan of care. PT then d/c home by provider

## 2021-08-20 NOTE — MAU Provider Note (Signed)
S Gabrielle Garcia is a 21 y.o. G2P0010 @15 .3 wks female who presents to MAU today with complaint of vaginal discharge. Reports yellow vaginal discharge. HAs burning after IC. Reports her and partner taking medicine for trich but did not wait to have sex. Report intermittent mild cramping.   ROS: +vaginal discharge +cramping  O BP 115/75 (BP Location: Right Arm)    Pulse 97    Temp 98.7 F (37.1 C) (Oral)    Resp 17    LMP 05/04/2021    SpO2 100%  Physical Exam Vitals and nursing note reviewed.  Constitutional:      General: She is not in acute distress.    Appearance: Normal appearance.  HENT:     Head: Normocephalic and atraumatic.  Pulmonary:     Effort: Pulmonary effort is normal. No respiratory distress.  Abdominal:     General: There is no distension.     Palpations: Abdomen is soft.     Tenderness: There is no abdominal tenderness.  Musculoskeletal:        General: Normal range of motion.     Cervical back: Normal range of motion.  Skin:    General: Skin is warm and dry.  Neurological:     General: No focal deficit present.     Mental Status: She is alert and oriented to person, place, and time.  Psychiatric:        Mood and Affect: Mood normal.        Behavior: Behavior normal.  FHT 150 Results for orders placed or performed during the hospital encounter of 08/20/21 (from the past 24 hour(s))  Wet prep, genital     Status: None   Collection Time: 08/20/21  6:56 PM  Result Value Ref Range   Yeast Wet Prep HPF POC NONE SEEN NONE SEEN   Trich, Wet Prep NONE SEEN NONE SEEN   Clue Cells Wet Prep HPF POC NONE SEEN NONE SEEN   WBC, Wet Prep HPF POC <10 <10   Sperm NONE SEEN    MDM: Labs ordered and reviewed. GC pending. Will retreat and pt instructed to abstain for 2 weeks after both are treated. Partner will need to get tested/treated. Stable for discharge home.   A [redacted] weeks gestation Hx of trichomonas Vaginal discharge   P Discharge from MAU in  stable condition Follow up at [facility] tomorrow as scheduled Rx Flagyl Warning signs for worsening condition that would warrant emergency follow-up discussed Patient may return to MAU as needed for pregnancy related complaints  Gabrielle Garcia, [place] 08/20/2021 7:38 PM

## 2021-08-20 NOTE — ED Provider Notes (Signed)
Patient is a 21 year old female presents the emergency department complaining of vaginal discharge.  She is [redacted] weeks pregnant.  She was previously treated for infection, but she would not answer further details about.  She is not having any abdominal pain or any vaginal bleeding.  Called MAU APP who accepted patient   Gabrielle Garcia 08/20/21 1801    Cathren Laine, MD 08/20/21 2330

## 2021-08-21 ENCOUNTER — Ambulatory Visit (INDEPENDENT_AMBULATORY_CARE_PROVIDER_SITE_OTHER): Payer: Medicaid Other | Admitting: Family Medicine

## 2021-08-21 VITALS — BP 114/76 | Wt 127.8 lb

## 2021-08-21 DIAGNOSIS — A5901 Trichomonal vulvovaginitis: Secondary | ICD-10-CM | POA: Insufficient documentation

## 2021-08-21 DIAGNOSIS — Z3A15 15 weeks gestation of pregnancy: Secondary | ICD-10-CM

## 2021-08-21 DIAGNOSIS — O23599 Infection of other part of genital tract in pregnancy, unspecified trimester: Secondary | ICD-10-CM | POA: Insufficient documentation

## 2021-08-21 DIAGNOSIS — O23592 Infection of other part of genital tract in pregnancy, second trimester: Secondary | ICD-10-CM | POA: Diagnosis not present

## 2021-08-21 DIAGNOSIS — Z3492 Encounter for supervision of normal pregnancy, unspecified, second trimester: Secondary | ICD-10-CM | POA: Diagnosis not present

## 2021-08-21 DIAGNOSIS — D563 Thalassemia minor: Secondary | ICD-10-CM | POA: Diagnosis not present

## 2021-08-21 LAB — GC/CHLAMYDIA PROBE AMP (~~LOC~~) NOT AT ARMC
Chlamydia: NEGATIVE
Comment: NEGATIVE
Comment: NORMAL
Neisseria Gonorrhea: NEGATIVE

## 2021-08-23 NOTE — Progress Notes (Signed)
° ° °  PRENATAL VISIT NOTE- [facility]regnancy Cycle 1  Subjective:  Stepfanie Macenzie Garcia is a 21 y.o. G2P0010 at [redacted]w[redacted]d being seen today for ongoing prenatal care through [facility].  She is currently monitored for the following issues for this low-risk pregnancy and has Atopic dermatitis; Supervision of low-risk pregnancy; Asthma; Alpha thalassemia silent carrier; and Trichomonal vaginitis during pregnancy on their problem list.  Patient reports no complaints.  Contractions: Not present. Vag. Bleeding: None.  Movement: Absent. Denies leaking of fluid/ROM.   The following portions of the patient's history were reviewed and updated as appropriate: allergies, current medications, past family history, past medical history, past social history, past surgical history and problem list. Problem list updated.  Objective:   Vitals:   08/21/21 1729  BP: 114/76  Weight: 127 lb 12.8 oz (58 kg)    Fetal Status: Fetal Heart Rate (bpm): 155   Movement: Absent     General:  Alert, oriented and cooperative. Patient is in no acute distress.  Skin: Skin is warm and dry. No rash noted.   Cardiovascular: Normal heart rate noted  Respiratory: Normal respiratory effort, no problems with respiration noted  Abdomen: Soft, gravid, appropriate for gestational age.  Pain/Pressure: Absent     Pelvic: Cervical exam deferred        Extremities: Normal range of motion.  Edema: None  Mental Status: Normal mood and affect. Normal behavior. Normal judgment and thought content.   Assessment and Plan:  Pregnancy: G2P0010 at [redacted]w[redacted]d  1. Trichomonal vaginitis during pregnancy in second trimester Needs TOC at next visit  2. Encounter for supervision of low-risk pregnancy in second trimester  Centering Pregnancy, Session#1: Introduction to model of care. Group determined rules for self-governance and closing phrase. Oriented group to space and mother's notebook.   Facilitated discussion today:  Common  Discomforts of pregnancy, When to Contact practice  Mindfulness activity completed as well as introduction to deep breathing for childbirth preparation- Centering 3 deep breaths  Fundal height and FHR appropriate today unless noted otherwise in plan. Patient to continue group care.   Given general pregnancy work restriction letter  3. Alpha thalassemia silent carrier - wants partner test kit and was given this today   Preterm labor symptoms and general obstetric precautions including but not limited to vaginal bleeding, contractions, leaking of fluid and fetal movement were reviewed in detail with the patient. Please refer to After Visit Summary for other counseling recommendations.  Return in about 4 weeks (around 09/18/2021).  Future Appointments  Date Time Provider [place]  09/17/2021  8:30 AM [facility] US3 [facility]  09/18/2021  2:00 PM CENTERING PROVIDER [facility]  10/16/2021  2:00 PM CENTERING PROVIDER [facility]  11/13/2021  2:00 PM CENTERING PROVIDER [facility]  11/27/2021  2:00 PM CENTERING PROVIDER [facility]  12/11/2021  2:00 PM CENTERING PROVIDER [facility]  12/25/2021  2:00 PM CENTERING PROVIDER [facility]  01/08/2022  2:00 PM CENTERING PROVIDER [facility]  01/22/2022  2:00 PM CENTERING PROVIDER [facility]  02/05/2022  2:00 PM CENTERING PROVIDER [facility]    Caren Macadam, MD

## 2021-08-26 ENCOUNTER — Encounter: Payer: Self-pay | Admitting: Family Medicine

## 2021-08-28 ENCOUNTER — Encounter: Payer: Self-pay | Admitting: *Deleted

## 2021-09-04 ENCOUNTER — Other Ambulatory Visit: Payer: Self-pay | Admitting: Family Medicine

## 2021-09-04 ENCOUNTER — Other Ambulatory Visit: Payer: Self-pay

## 2021-09-04 ENCOUNTER — Inpatient Hospital Stay (HOSPITAL_COMMUNITY)
Admission: AD | Admit: 2021-09-04 | Discharge: 2021-09-04 | Disposition: A | Discharge status: Home / Self Care | Payer: Medicaid Other | Attending: Obstetrics & Gynecology | Admitting: Obstetrics & Gynecology

## 2021-09-04 ENCOUNTER — Encounter (HOSPITAL_COMMUNITY): Payer: Self-pay | Admitting: Obstetrics & Gynecology

## 2021-09-04 DIAGNOSIS — R109 Unspecified abdominal pain: Secondary | ICD-10-CM | POA: Diagnosis present

## 2021-09-04 DIAGNOSIS — Z3A17 17 weeks gestation of pregnancy: Secondary | ICD-10-CM | POA: Diagnosis not present

## 2021-09-04 DIAGNOSIS — O26892 Other specified pregnancy related conditions, second trimester: Secondary | ICD-10-CM | POA: Insufficient documentation

## 2021-09-04 DIAGNOSIS — Z87891 Personal history of nicotine dependence: Secondary | ICD-10-CM | POA: Insufficient documentation

## 2021-09-04 DIAGNOSIS — N898 Other specified noninflammatory disorders of vagina: Secondary | ICD-10-CM | POA: Diagnosis not present

## 2021-09-04 DIAGNOSIS — Z3490 Encounter for supervision of normal pregnancy, unspecified, unspecified trimester: Secondary | ICD-10-CM

## 2021-09-04 HISTORY — DX: Personal history of other infectious and parasitic diseases: Z86.19

## 2021-09-04 LAB — URINALYSIS, ROUTINE W REFLEX MICROSCOPIC
Bilirubin Urine: NEGATIVE
Glucose, UA: NEGATIVE mg/dL
Hgb urine dipstick: NEGATIVE
Ketones, ur: NEGATIVE mg/dL
Leukocytes,Ua: NEGATIVE
Nitrite: NEGATIVE
Protein, ur: NEGATIVE mg/dL
Specific Gravity, Urine: 1.025 (ref 1.005–1.030)
pH: 7.5 (ref 5.0–8.0)

## 2021-09-04 LAB — WET PREP, GENITAL
Sperm: NONE SEEN
Trich, Wet Prep: NONE SEEN
WBC, Wet Prep HPF POC: <10 (ref <10)
Yeast Wet Prep HPF POC: NONE SEEN

## 2021-09-04 LAB — GC/CHLAMYDIA PROBE AMP (~~LOC~~) NOT AT ARMC
Chlamydia: NEGATIVE
Comment: NEGATIVE
Comment: NORMAL
Neisseria Gonorrhea: NEGATIVE

## 2021-09-04 NOTE — Progress Notes (Signed)
Discussed discharge instructions with pt; pt verbalized understanding.

## 2021-09-04 NOTE — MAU Note (Signed)
..  Gabrielle Garcia is a 21 y.o. at [redacted]w[redacted]d here in MAU reporting: intermittent cramping on left side and sometimes radiates to the back for a week or two, but around 0000 the cramping began more intense. Pt was in the shower and a large amount of yellow with white streak vaginal mucous came out once. Pt denies VB and LOF. Pt reports last intercourse was Monday night.   Onset of complaint: 0000 Pain score: 5/10 Vitals:   09/04/21 0249  BP: 116/67  Pulse: 93  Resp: 16  Temp: 98.1 F (36.7 C)  SpO2: 100%     FHT:155 Lab orders placed from triage:  UA

## 2021-09-04 NOTE — MAU Provider Note (Signed)
History     CSN: [id number]  Arrival date and time: 09/04/21 0234   Event Date/Time   First Provider Initiated Contact with Patient 09/04/21 [phone]      Chief Complaint  Patient presents with   Abdominal Pain   Vaginal Discharge   HPI Gabrielle Garcia is a 21 y.o. G2P0010 at 60w4dwho presents with abdominal cramping & vaginal discharge.  Reports intermittent abdominal cramping for the last 2 weeks that worsened at midnight. Rates pain 5/10. Nothing makes better or worse. Hasn't treated symptoms. Was retreated for trichomonas on 1/17 after reexposure. States her & her partner took the treatment on 1/19 or 1/20. Resumed unprotected intercourse on Monday (1/30). States immediately after intercourse she had vaginal burning. Burning & irritation has not continued. Noticed heavy yellow mucoid discharge this evening.  Denies dysuria, vaginal itching, or vaginal bleeding.  Denies n/v. Reports some diarrhea which is normal for her depending on what she eats.   OB History     Gravida  2   Para  0   Term  0   Preterm  0   AB  1   Living  0      SAB  1   IAB  0   Ectopic  0   Multiple  0   Live Births  0           Past Medical History:  Diagnosis Date   Asthma    Eczema    Hx of chlamydia infection 2020   Hx of trichomoniasis     Past Surgical History:  Procedure Laterality Date   WISDOM TOOTH EXTRACTION      Family History  Problem Relation Age of Onset   Miscarriages / Stillbirths Mother    Hypertension Mother    Eczema Mother    Urticaria Mother    Hypertension Father    Asthma Brother    Hypertension Other    Diabetes Other    Allergic rhinitis Neg Hx    Angioedema Neg Hx    Atopy Neg Hx    Immunodeficiency Neg Hx     Social History   Tobacco Use   Smoking status: Former    Types: Cigars   Smokeless tobacco: Never  Vaping Use   Vaping Use: Never used  Substance Use Topics   Alcohol use: No   Drug use: No    Allergies:   Allergies  Allergen Reactions   Benadryl [Diphenhydramine] Itching and Swelling   Pineapple Itching and Swelling    Medications Prior to Admission  Medication Sig Dispense Refill Last Dose   Prenatal 28-0.8 MG TABS Take 1 tablet by mouth daily. 30 tablet 12 09/03/2021   albuterol (VENTOLIN HFA) 108 (90 Base) MCG/ACT inhaler Inhale 2 puffs into the lungs every 4 (four) hours as needed for wheezing or shortness of breath (or cough). 8 g 3    Blood Pressure Monitoring (BLOOD PRESSURE KIT) DEVI 1 Device by Does not apply route as needed. (Patient not taking: Reported on 08/21/2021) 1 each 0    cetirizine (ZYRTEC) 10 MG tablet Take 1 tablet (10 mg total) by mouth daily. (Patient not taking: Reported on 07/31/2021) 30 tablet 11    fluticasone (FLONASE) 50 MCG/ACT nasal spray Place 2 sprays into both nostrils daily. (Patient not taking: Reported on 08/21/2021) 16 g 12    [EXPIRED] metroNIDAZOLE (FLAGYL) 500 MG tablet Take 2,000 mg by mouth once.       Review of Systems  Constitutional: Negative.  Gastrointestinal:  Positive for abdominal pain and diarrhea. Negative for constipation, nausea and vomiting.  Genitourinary:  Positive for vaginal discharge and vaginal pain (post coital). Negative for dysuria, genital sores and vaginal bleeding.  Physical Exam   Blood pressure 112/70, pulse 89, temperature 98.1 F (36.7 C), temperature source Oral, resp. rate 16, height 5' 4"  (1.626 m), weight 58.6 kg, last menstrual period 05/04/2021, SpO2 100 %.  Physical Exam Vitals and nursing note reviewed. Exam conducted with a chaperone present.  Constitutional:      General: She is not in acute distress.    Appearance: She is well-developed.  HENT:     Head: Normocephalic and atraumatic.  Eyes:     General: No scleral icterus. Pulmonary:     Effort: Pulmonary effort is normal. No respiratory distress.  Genitourinary:    Comments: Pelvic: NEFG, scant white mucoid discharge. No blood. Cervix  pink/smooth/not friable.  Cervix closed/thick/firm Skin:    General: Skin is warm and dry.  Neurological:     General: No focal deficit present.     Mental Status: She is alert.  Psychiatric:        Mood and Affect: Mood normal.        Behavior: Behavior normal.    MAU Course  Procedures Results for orders placed or performed during the hospital encounter of 09/04/21 (from the past 24 hour(s))  Urinalysis, Routine w reflex microscopic     Status: None   Collection Time: 09/04/21  2:56 AM  Result Value Ref Range   Color, Urine YELLOW YELLOW   APPearance CLEAR CLEAR   Specific Gravity, Urine 1.025 1.005 - 1.030   pH 7.5 5.0 - 8.0   Glucose, UA NEGATIVE NEGATIVE mg/dL   Hgb urine dipstick NEGATIVE NEGATIVE   Bilirubin Urine NEGATIVE NEGATIVE   Ketones, ur NEGATIVE NEGATIVE mg/dL   Protein, ur NEGATIVE NEGATIVE mg/dL   Nitrite NEGATIVE NEGATIVE   Leukocytes,Ua NEGATIVE NEGATIVE  Wet prep, genital     Status: Abnormal   Collection Time: 09/04/21  3:43 AM  Result Value Ref Range   Yeast Wet Prep HPF POC NONE SEEN NONE SEEN   Trich, Wet Prep NONE SEEN NONE SEEN   Clue Cells Wet Prep HPF POC PRESENT (A) NONE SEEN   WBC, Wet Prep HPF POC <10 <10   Sperm NONE SEEN     MDM Patient presents with abdominal cramping & vaginal discharge. Has been treated for trichomonas twice in the last month due to reexposure but most recent testing on 1/17 was negative.  Wet prep tonight is negative again.  Pelvic exam performed - no abnormal discharge & cervix closed.  Wet prep is positive for clue cells but otherwise does not meet amsel criteria for BV U/a negative  Assessment and Plan   1. Vaginal discharge during pregnancy in second trimester   2. [redacted] weeks gestation of pregnancy    -GC/CT pending -use condoms   Jorje Guild 09/04/2021, 4:10 AM

## 2021-09-04 NOTE — MAU Note (Signed)
Pt states she did wait to have sex after being retreated for trich on 1/17.. reports when her and her partner had sex on Monday she was still experiencing burning.

## 2021-09-16 ENCOUNTER — Encounter: Payer: Self-pay | Admitting: *Deleted

## 2021-09-17 ENCOUNTER — Other Ambulatory Visit: Payer: Medicaid Other

## 2021-09-17 ENCOUNTER — Other Ambulatory Visit: Payer: Self-pay

## 2021-09-17 ENCOUNTER — Encounter: Payer: Self-pay | Admitting: *Deleted

## 2021-09-17 ENCOUNTER — Other Ambulatory Visit: Payer: Self-pay | Admitting: *Deleted

## 2021-09-17 ENCOUNTER — Encounter: Payer: Self-pay | Admitting: Family Medicine

## 2021-09-17 ENCOUNTER — Ambulatory Visit: Payer: Medicaid Other | Attending: Obstetrics & Gynecology

## 2021-09-17 DIAGNOSIS — Z3490 Encounter for supervision of normal pregnancy, unspecified, unspecified trimester: Secondary | ICD-10-CM

## 2021-09-17 DIAGNOSIS — Z3A19 19 weeks gestation of pregnancy: Secondary | ICD-10-CM | POA: Insufficient documentation

## 2021-09-17 DIAGNOSIS — O43199 Other malformation of placenta, unspecified trimester: Secondary | ICD-10-CM | POA: Insufficient documentation

## 2021-09-17 DIAGNOSIS — Z363 Encounter for antenatal screening for malformations: Secondary | ICD-10-CM | POA: Insufficient documentation

## 2021-09-17 DIAGNOSIS — O43192 Other malformation of placenta, second trimester: Secondary | ICD-10-CM

## 2021-09-17 DIAGNOSIS — O43122 Velamentous insertion of umbilical cord, second trimester: Secondary | ICD-10-CM | POA: Insufficient documentation

## 2021-09-17 DIAGNOSIS — D563 Thalassemia minor: Secondary | ICD-10-CM | POA: Insufficient documentation

## 2021-09-17 DIAGNOSIS — Z148 Genetic carrier of other disease: Secondary | ICD-10-CM | POA: Diagnosis not present

## 2021-09-17 DIAGNOSIS — Z3689 Encounter for other specified antenatal screening: Secondary | ICD-10-CM

## 2021-09-18 ENCOUNTER — Other Ambulatory Visit: Payer: Medicaid Other

## 2021-09-18 ENCOUNTER — Ambulatory Visit (INDEPENDENT_AMBULATORY_CARE_PROVIDER_SITE_OTHER): Payer: Medicaid Other | Admitting: Family Medicine

## 2021-09-18 VITALS — BP 116/73 | Wt 135.8 lb

## 2021-09-18 DIAGNOSIS — O23592 Infection of other part of genital tract in pregnancy, second trimester: Secondary | ICD-10-CM

## 2021-09-18 DIAGNOSIS — Z3A19 19 weeks gestation of pregnancy: Secondary | ICD-10-CM

## 2021-09-18 DIAGNOSIS — D563 Thalassemia minor: Secondary | ICD-10-CM

## 2021-09-18 DIAGNOSIS — A5901 Trichomonal vulvovaginitis: Secondary | ICD-10-CM

## 2021-09-18 DIAGNOSIS — Z3492 Encounter for supervision of normal pregnancy, unspecified, second trimester: Secondary | ICD-10-CM

## 2021-09-18 DIAGNOSIS — O43199 Other malformation of placenta, unspecified trimester: Secondary | ICD-10-CM

## 2021-09-18 DIAGNOSIS — L209 Atopic dermatitis, unspecified: Secondary | ICD-10-CM

## 2021-09-18 DIAGNOSIS — Z3490 Encounter for supervision of normal pregnancy, unspecified, unspecified trimester: Secondary | ICD-10-CM

## 2021-09-18 MED ORDER — BLOOD PRESSURE KIT DEVI: 1.0000 | 0 refills | Status: DC | PRN

## 2021-09-18 NOTE — Progress Notes (Addendum)
° ° ° ° °  PRENATAL VISIT NOTE  Subjective:  Gabrielle Garcia is a 21 y.o. G2P0010 at [redacted]w[redacted]d being seen today for ongoing prenatal care.  She is currently monitored for the following issues for this high-risk pregnancy and has Atopic dermatitis; Supervision of low-risk pregnancy; Asthma; Alpha thalassemia silent carrier; Trichomonal vaginitis during pregnancy; and Marginal insertion of umbilical cord affecting management of mother on their problem list.  Patient reports no complaints. Seen in MAU for vaginal discharge. Swab negative. Contractions: Not present. Vag. Bleeding: None.  Movement: Present. Denies leaking of fluid.   The following portions of the patient's history were reviewed and updated as appropriate: allergies, current medications, past family history, past medical history, past social history, past surgical history and problem list.   Objective:   Vitals:   09/20/21 0810  BP: 116/73  Weight: 135 lb 12.8 oz (61.6 kg)    Fetal Status: Fetal Heart Rate (bpm): 150 Fundal Height: 19 cm Movement: Present     General:  Alert, oriented and cooperative. Patient is in no acute distress.  Skin: Skin is warm and dry. No rash noted.   Cardiovascular: Normal heart rate noted  Respiratory: Normal respiratory effort, no problems with respiration noted  Abdomen: Soft, gravid, appropriate for gestational age.  Pain/Pressure: Absent     Pelvic: Cervical exam deferred        Extremities: Normal range of motion.  Edema: None  Mental Status: Normal mood and affect. Normal behavior. Normal judgment and thought content.   Assessment and Plan:  Pregnancy: G2P0010 at [redacted]w[redacted]d 1. Encounter for supervision of low-risk pregnancy, antepartum   Centering Pregnancy, Session#2: Reviewed rules for self-governance with group. Direct group to [employer].   Facilitated discussion today:  Nutrition, Back pain, mindful eating.   Mindfulness activity completed as well as deep breathing with 1  minutes of tension and 1 minute of relaxation for childbirth preparation.  Also participated in mindful eating activity Activity-demonstrated exercises to alleviate back pain.  Fundal height and FHR appropriate today unless noted otherwise in plan. Patient to continue group care.    2. Trichomonal vaginitis during pregnancy in second trimester TOC negative on 2/1  3. Marginal insertion of umbilical cord affecting management of mother Has follow up US in 6 wks for growth  4. Alpha thalassemia silent carrier Has upcoming genetic counseling visit  5. Atopic dermatitis, unspecified type Discussed sx and patient is OK to use her topical steroid.  Preterm labor symptoms and general obstetric precautions including but not limited to vaginal bleeding, contractions, leaking of fluid and fetal movement were reviewed in detail with the patient. Please refer to After Visit Summary for other counseling recommendations.   Return in about 4 weeks (around 10/16/2021) for Centering.  Future Appointments  Date Time Provider [place]  09/25/2021 10:30 AM [facility] GENETIC COUNSELING RM [facility]  10/16/2021  2:00 PM CENTERING PROVIDER [facility] [place]  10/29/2021  8:45 AM [facility] US4 [facility]  11/13/2021  2:00 PM CENTERING PROVIDER [facility]  11/20/2021  8:20 AM [facility] LAB [facility]  11/27/2021  2:00 PM CENTERING PROVIDER [facility]  12/11/2021  2:00 PM CENTERING PROVIDER [facility]  12/25/2021  2:00 PM CENTERING PROVIDER [facility]  01/08/2022  2:00 PM CENTERING PROVIDER [facility]  01/22/2022  2:00 PM CENTERING PROVIDER [facility]  02/05/2022  2:00 PM CENTERING PROVIDER [facility]    Caren Macadam, MD

## 2021-09-19 ENCOUNTER — Encounter: Payer: Self-pay | Admitting: *Deleted

## 2021-09-20 LAB — AFP, SERUM, OPEN SPINA BIFIDA
AFP MoM: 0.65
AFP Value: 41.9 ng/mL
Gest. Age on Collection Date: 19.4 weeks
Maternal Age At EDD: 21 yr
OSBR Risk 1 IN: 10000
Test Results:: NEGATIVE
Weight: 134 [lb_av]

## 2021-09-23 ENCOUNTER — Encounter: Payer: Self-pay | Admitting: Genetics

## 2021-09-25 ENCOUNTER — Ambulatory Visit: Payer: Medicaid Other | Attending: Obstetrics and Gynecology

## 2021-10-16 ENCOUNTER — Ambulatory Visit (INDEPENDENT_AMBULATORY_CARE_PROVIDER_SITE_OTHER): Payer: Medicaid Other | Admitting: Family Medicine

## 2021-10-16 ENCOUNTER — Other Ambulatory Visit: Payer: Self-pay

## 2021-10-16 VITALS — BP 117/83 | HR 102 | Wt 141.5 lb

## 2021-10-16 DIAGNOSIS — Z3492 Encounter for supervision of normal pregnancy, unspecified, second trimester: Secondary | ICD-10-CM

## 2021-10-16 DIAGNOSIS — A5901 Trichomonal vulvovaginitis: Secondary | ICD-10-CM

## 2021-10-16 DIAGNOSIS — O23591 Infection of other part of genital tract in pregnancy, first trimester: Secondary | ICD-10-CM

## 2021-10-16 DIAGNOSIS — O43199 Other malformation of placenta, unspecified trimester: Secondary | ICD-10-CM

## 2021-10-16 NOTE — Progress Notes (Signed)
? ? ? ?  PRENATAL VISIT NOTE ? ?Subjective:  ?Gabrielle Garcia is a 21 y.o. G2P0010 at [redacted]w[redacted]d being seen today for ongoing prenatal care.  She is currently monitored for the following issues for this low-risk pregnancy and has Atopic dermatitis; Supervision of low-risk pregnancy; Asthma; Alpha thalassemia silent carrier; Trichomonal vaginitis during pregnancy; and Marginal insertion of umbilical cord affecting management of mother on their problem list. ? ?Patient reports no complaints.  Contractions: Not present. Vag. Bleeding: None.  Movement: Present. Denies leaking of fluid.  ? ?The following portions of the patient's history were reviewed and updated as appropriate: allergies, current medications, past family history, past medical history, past social history, past surgical history and problem list.  ? ?Objective:  ? ?Vitals:  ? 10/16/21 1704  ?BP: 117/83  ?Pulse: (!) 102  ?Weight: 141 lb 8 oz (64.2 kg)  ? ? ?Fetal Status: Fetal Heart Rate (bpm): 145 Fundal Height: 23 cm Movement: Present    ? ?General:  Alert, oriented and cooperative. Patient is in no acute distress.  ?Skin: Skin is warm and dry. No rash noted.   ?Cardiovascular: Normal heart rate noted  ?Respiratory: Normal respiratory effort, no problems with respiration noted  ?Abdomen: Soft, gravid, appropriate for gestational age.  Pain/Pressure: Absent     ?Pelvic: Cervical exam deferred        ?Extremities: Normal range of motion.  Edema: None  ?Mental Status: Normal mood and affect. Normal behavior. Normal judgment and thought content.  ? ?Assessment and Plan:  ?Pregnancy: G2P0010 at [redacted]w[redacted]d ? ?1. Marginal insertion of umbilical cord affecting management of mother ?Has growth [place] scheduled  ? ?2. Trichomonal vaginitis during pregnancy in first trimester ?TOC was negative ? ?3. Encounter for supervision of low-risk pregnancy in second trimester ? ?Centering Pregnancy, Session#3: Reviewed resources in [employer].  ? ?Facilitated discussion  today:  Breastfeeding/infant nutrition ?Mindfulness activity completed as well as deep breathing with still touch for childbirth preparation.   ? ?Fundal height and FHR appropriate today unless noted otherwise in plan. Patient to continue group care.  ? ? ?Preterm labor symptoms and general obstetric precautions including but not limited to vaginal bleeding, contractions, leaking of fluid and fetal movement were reviewed in detail with the patient. ?Please refer to After Visit Summary for other counseling recommendations.  ? ?Return in about 4 weeks (around 11/13/2021) for Centering. ? ?Future Appointments  ?Date Time Provider Department Center  ?10/29/2021  8:45 AM [facility] US4 [facility]  ?11/13/2021  2:00 PM CENTERING PROVIDER [facility]  ?11/20/2021  8:20 AM [facility] LAB [facility]  ?11/27/2021  2:00 PM CENTERING PROVIDER [facility]  ?12/11/2021  2:00 PM CENTERING PROVIDER [facility]  ?12/25/2021  2:00 PM CENTERING PROVIDER [facility]  ?01/08/2022  2:00 PM CENTERING PROVIDER [facility]  ?01/22/2022  2:00 PM CENTERING PROVIDER [facility]  ?02/05/2022  2:00 PM CENTERING PROVIDER [facility]  ? ? ?[name], MD ?

## 2021-10-17 ENCOUNTER — Encounter: Payer: Self-pay | Admitting: *Deleted

## 2021-10-29 ENCOUNTER — Ambulatory Visit: Payer: Medicaid Other | Attending: Obstetrics and Gynecology

## 2021-10-29 ENCOUNTER — Other Ambulatory Visit: Payer: Self-pay

## 2021-10-29 ENCOUNTER — Ambulatory Visit: Payer: Medicaid Other

## 2021-10-29 ENCOUNTER — Other Ambulatory Visit: Payer: Self-pay | Admitting: *Deleted

## 2021-10-29 DIAGNOSIS — Z3A25 25 weeks gestation of pregnancy: Secondary | ICD-10-CM

## 2021-10-29 DIAGNOSIS — O285 Abnormal chromosomal and genetic finding on antenatal screening of mother: Secondary | ICD-10-CM

## 2021-10-29 DIAGNOSIS — O43193 Other malformation of placenta, third trimester: Secondary | ICD-10-CM

## 2021-10-29 DIAGNOSIS — O43192 Other malformation of placenta, second trimester: Secondary | ICD-10-CM

## 2021-10-29 DIAGNOSIS — D563 Thalassemia minor: Secondary | ICD-10-CM

## 2021-10-29 DIAGNOSIS — Z363 Encounter for antenatal screening for malformations: Secondary | ICD-10-CM | POA: Diagnosis not present

## 2021-10-30 DIAGNOSIS — H5213 Myopia, bilateral: Secondary | ICD-10-CM | POA: Diagnosis not present

## 2021-10-31 ENCOUNTER — Inpatient Hospital Stay (HOSPITAL_COMMUNITY)
Admission: AD | Admit: 2021-10-31 | Discharge: 2021-10-31 | Disposition: A | Discharge status: Home / Self Care | Payer: Medicaid Other | Attending: Obstetrics & Gynecology | Admitting: Obstetrics & Gynecology

## 2021-10-31 ENCOUNTER — Encounter (HOSPITAL_COMMUNITY): Payer: Self-pay | Admitting: Obstetrics & Gynecology

## 2021-10-31 DIAGNOSIS — O368120 Decreased fetal movements, second trimester, not applicable or unspecified: Secondary | ICD-10-CM | POA: Diagnosis not present

## 2021-10-31 DIAGNOSIS — Z3A25 25 weeks gestation of pregnancy: Secondary | ICD-10-CM | POA: Diagnosis not present

## 2021-10-31 NOTE — MAU Provider Note (Signed)
?History  ?  ? ?[id number] ? ?Arrival date and time: 10/31/21 0227 ?  ? ?Chief Complaint  ?Patient presents with  ? Decreased Fetal Movement  ? ? ? ?HPI ?Gabrielle Garcia is a 21 y.o. at 49w5dby LMP who presents for decreased fetal movement. ?Symptoms started yesterday. Reports only feeling baby move once yesterday. Now feels like movements aren't as strong as previously. Denies abdominal pain, leaking of fluid, or vaginal bleeding. No abdominal trauma.   ? ?OB History   ? ? Gravida  ?2  ? Para  ?0  ? Term  ?0  ? Preterm  ?0  ? AB  ?1  ? Living  ?0  ?  ? ? SAB  ?1  ? IAB  ?0  ? Ectopic  ?0  ? Multiple  ?0  ? Live Births  ?0  ?   ?  ?  ? ? ?Past Medical History:  ?Diagnosis Date  ? Asthma   ? Eczema   ? Hx of chlamydia infection 2020  ? Hx of trichomoniasis   ? ? ?Past Surgical History:  ?Procedure Laterality Date  ? WISDOM TOOTH EXTRACTION    ? ? ?Family History  ?Problem Relation Age of Onset  ? Miscarriages / [place]   ? Hypertension Mother   ? Eczema Mother   ? Urticaria Mother   ? Hypertension Father   ? Asthma Brother   ? Hypertension Other   ? Diabetes Other   ? Allergic rhinitis Neg Hx   ? Angioedema Neg Hx   ? Atopy Neg Hx   ? Immunodeficiency Neg Hx   ? ? ?Allergies  ?Allergen Reactions  ? Benadryl [Diphenhydramine] Itching and Swelling  ? Pineapple Itching and Swelling  ? ? ?No current facility-administered medications on file prior to encounter.  ? ?Current Outpatient Medications on File Prior to Encounter  ?Medication Sig Dispense Refill  ? albuterol (VENTOLIN HFA) 108 (90 Base) MCG/ACT inhaler Inhale 2 puffs into the lungs every 4 (four) hours as needed for wheezing or shortness of breath (or cough). 8 g 3  ? Prenatal 28-0.8 MG TABS Take 1 tablet by mouth daily. 30 tablet 12  ? Blood Pressure Monitoring (BLOOD PRESSURE KIT) DEVI 1 Device by Does not apply route as needed. (Patient not taking: Reported on 08/21/2021) 1 each 0  ? Blood Pressure Monitoring (BLOOD PRESSURE KIT) DEVI 1  Device by Does not apply route as needed. 1 each 0  ? cetirizine (ZYRTEC) 10 MG tablet Take 1 tablet (10 mg total) by mouth daily. (Patient not taking: Reported on 07/31/2021) 30 tablet 11  ? [DISCONTINUED] norgestimate-ethinyl estradiol (ORTHO-CYCLEN,SPRINTEC,PREVIFEM) 0.25-35 MG-MCG tablet Take 1 tablet by mouth daily. (Patient not taking: Reported on 07/19/2018) 1 Package 11  ? ? ? ?ROS ?Pertinent positives and negative per HPI, all others reviewed and negative ? ?Physical Exam  ? ?BP 118/69 (BP Location: Right Arm)   Pulse 91   Temp 98.5 ?F (36.9 ?C)   Resp 16   Ht _0  (1.626 m)   Wt 67.6 kg   LMP 05/04/2021   SpO2 100%   BMI 25.58 kg/m?  ? ?Patient Vitals for the past 24 hrs: ? BP Temp Pulse Resp SpO2 Height Weight  ?10/31/21 0414 118/69 -- 91 16 -- -- --  ?10/31/21 0243 120/73 -- -- -- -- -- --  ?10/31/21 0240 -- 98.5 ?F (36.9 ?C) 97 17 100 % _1  (1.626 m) 67.6 kg  ? ? ?Physical Exam ?Vitals and  nursing note reviewed.  ?Constitutional:   ?   General: She is not in acute distress. ?   Appearance: Normal appearance.  ?HENT:  ?   Head: Normocephalic and atraumatic.  ?Eyes:  ?   General: No scleral icterus. ?   Conjunctiva/sclera: Conjunctivae normal.  ?Pulmonary:  ?   Effort: Pulmonary effort is normal. No respiratory distress.  ?Abdominal:  ?   Palpations: Abdomen is soft.  ?   Tenderness: There is no abdominal tenderness.  ?   Comments: Fetal movement palpated during abdominal exam  ?Skin: ?   General: Skin is warm and dry.  ?Neurological:  ?   Mental Status: She is alert.  ?Psychiatric:     ?   Mood and Affect: Mood normal.     ?   Behavior: Behavior normal.  ?  ? ? ?Bedside Ultrasound ?Pt informed that the ultrasound is considered a limited OB ultrasound and is not intended to be a complete ultrasound exam.  Patient also informed that the ultrasound is not being completed with the intent of assessing for fetal or placental anomalies or any pelvic abnormalities.  Explained that the purpose of  today?s ultrasound is to assess for   presentation to assist with fetal monitor placement .  Patient acknowledges the purpose of the exam and the limitations of the study.   ? ? ?My interpretation: Active fetus in frank breech position ? ?FHT ?Baseline 150, min to mod variability, no accels, no decels ?Toco: flat ?Cat: 1 ? ?Labs ?No results found for this or any previous visit (from the past 24 hour(s)). ? ?Imaging ?No results found. ? ?MAU Course  ?Procedures ?Lab Orders  ?No laboratory test(s) ordered today  ? ?No orders of the defined types were placed in this encounter. ? ?Imaging Orders  ?No imaging studies ordered today  ? ? ?MDM ?Patient presents due to report of decreased fetal movement since yesterday. Reports 18 movements in 1 hour while in MAU.  ?Bedside ultrasound was performed to assist with fetal monitor placement. Fetal movement was seen on ultrasound. Patient reassured.  ?Fetal tracing appropriate for gestational age.   ?Assessment and Plan  ? ?1. Decreased fetal movements in second trimester, single or unspecified fetus   ?2. [redacted] weeks gestation of pregnancy   ? ?-reviewed reasons to return to MAU ? ?Jorje Guild, NP ?10/31/21 ?4:19 AM ? ? ?

## 2021-10-31 NOTE — Progress Notes (Signed)
Toco removed by Judeth Horn NP ?

## 2021-10-31 NOTE — MAU Note (Signed)
Baby is moving some now and pt aware of FM. Using button to mark FM ?

## 2021-10-31 NOTE — Progress Notes (Signed)
Difficult to monitor FHTs due to extreme angle of transducer to pick up FHTs. Pt holding transducer.  ?

## 2021-10-31 NOTE — Progress Notes (Signed)
Transducer adj by Jorje Guild NP after bedside u/s. Baby is breech and active. Pt feels FM and is reassured ?

## 2021-10-31 NOTE — Progress Notes (Signed)
Jorje Guild NP in earlier to discuss d/c plan. Written and verbal d/c instructions given and understanding voiced. ?

## 2021-10-31 NOTE — MAU Note (Addendum)
Baby has not moved like usual today. Usually movement is like a kick but today only like a "flutter" and only one time Weds afternoon. Denies VB or LOF. No pain. Stomach felt "tight" after she ate but has not been recurrent ?

## 2021-11-13 ENCOUNTER — Ambulatory Visit (INDEPENDENT_AMBULATORY_CARE_PROVIDER_SITE_OTHER): Payer: Medicaid Other | Admitting: Family Medicine

## 2021-11-13 VITALS — BP 121/79 | HR 101 | Wt 150.4 lb

## 2021-11-13 DIAGNOSIS — Z3493 Encounter for supervision of normal pregnancy, unspecified, third trimester: Secondary | ICD-10-CM

## 2021-11-13 DIAGNOSIS — L209 Atopic dermatitis, unspecified: Secondary | ICD-10-CM

## 2021-11-13 DIAGNOSIS — O43199 Other malformation of placenta, unspecified trimester: Secondary | ICD-10-CM

## 2021-11-13 MED ORDER — COMFORT FIT MATERNITY SUPP MED MISC: 1.0000 [IU] | Freq: Once | 0 refills | Status: AC

## 2021-11-13 NOTE — Progress Notes (Signed)
? ?  PRENATAL VISIT NOTE ? ?Subjective:  ?Gabrielle Garcia is a 21 y.o. G2P0010 at [redacted]w[redacted]d being seen today for ongoing prenatal care.  She is currently monitored for the following issues for this low-risk pregnancy and has Atopic dermatitis; Supervision of low-risk pregnancy; Asthma; Alpha thalassemia silent carrier; Trichomonal vaginitis during pregnancy; and Marginal insertion of umbilical cord affecting management of mother on their problem list. ? ?Patient reports no complaints.  Contractions: Not present. Vag. Bleeding: None.  Movement: Present. Denies leaking of fluid.  ? ?The following portions of the patient's history were reviewed and updated as appropriate: allergies, current medications, past family history, past medical history, past social history, past surgical history and problem list.  ? ?Objective:  ? ?Vitals:  ? 11/13/21 1745  ?BP: 121/79  ?Pulse: (!) 101  ?Weight: 150 lb 6.4 oz (68.2 kg)  ? ? ?Fetal Status: Fetal Heart Rate (bpm): 153 Fundal Height: 26 cm Movement: Present    ? ?General:  Alert, oriented and cooperative. Patient is in no acute distress.  ?Skin: Skin is warm and dry. No rash noted.   ?Cardiovascular: Normal heart rate noted  ?Respiratory: Normal respiratory effort, no problems with respiration noted  ?Abdomen: Soft, gravid, appropriate for gestational age.  Pain/Pressure: Absent     ?Pelvic: Cervical exam deferred        ?Extremities: Normal range of motion.  Edema: None  ?Mental Status: Normal mood and affect. Normal behavior. Normal judgment and thought content.  ? ?Assessment and Plan:  ?Pregnancy: G2P0010 at [redacted]w[redacted]d ?1. Atopic dermatitis, unspecified type ?- triamcinolone ointment (KENALOG) 0.5 %; Apply 1 application. topically 2 (two) times daily.  Dispense: 30 g; Refill: 6 ?- clobetasol cream (TEMOVATE) 0.05 %; Apply 1 application. topically 2 (two) times daily. Apply to affected area  Dispense: 30 g; Refill: 2 ? ?2. Encounter for supervision of low-risk pregnancy in third  trimester ? ?Centering Pregnancy, Session#4: Reviewed resources in [employer].  ? ?Facilitated discussion today:  Family dynamics/environment, family planning postpartum, and preterm labor ?Mindfulness activity completed as well as deep breathing with hand massage for childbirth preparation.   ? ?Fundal height and FHR appropriate today unless noted otherwise in plan. Patient to continue group care.  ? ?Has alb appt for GTT ? ?3. Marginal insertion of umbilical cord affecting management of mother ?Has growth [place]  ? ? ?Preterm labor symptoms and general obstetric precautions including but not limited to vaginal bleeding, contractions, leaking of fluid and fetal movement were reviewed in detail with the patient. ?Please refer to After Visit Summary for other counseling recommendations.  ? ?Return in about 2 weeks (around 11/27/2021) for Centering Pregnancy. ? ?Future Appointments  ?Date Time Provider Department Center  ?11/20/2021  8:20 AM [facility] LAB [facility]  ?11/27/2021  2:00 PM CENTERING PROVIDER [facility]  ?12/03/2021  8:30 AM [facility] NURSE [facility]  ?12/03/2021  8:45 AM [facility] US4 [facility]  ?12/11/2021  2:00 PM CENTERING PROVIDER [facility]  ?12/25/2021  2:00 PM CENTERING PROVIDER [facility]  ?01/08/2022  2:00 PM CENTERING PROVIDER [facility]  ?01/22/2022  2:00 PM CENTERING PROVIDER [facility]  ?02/05/2022  2:00 PM CENTERING PROVIDER [facility]  ? ? ?[name], MD ?

## 2021-11-15 ENCOUNTER — Encounter: Payer: Self-pay | Admitting: Family Medicine

## 2021-11-15 MED ORDER — CLOBETASOL PROPIONATE 0.05 % EX CREA: 1.0000 "application " | TOPICAL_CREAM | Freq: Two times a day (BID) | CUTANEOUS | 2 refills | Status: DC

## 2021-11-15 MED ORDER — TRIAMCINOLONE ACETONIDE 0.5 % EX OINT: 1.0000 "application " | TOPICAL_OINTMENT | Freq: Two times a day (BID) | CUTANEOUS | 6 refills | Status: DC

## 2021-11-18 ENCOUNTER — Other Ambulatory Visit: Payer: Self-pay

## 2021-11-18 DIAGNOSIS — Z3492 Encounter for supervision of normal pregnancy, unspecified, second trimester: Secondary | ICD-10-CM

## 2021-11-20 ENCOUNTER — Other Ambulatory Visit: Payer: Medicaid Other

## 2021-11-21 ENCOUNTER — Encounter: Payer: Self-pay | Admitting: Family Medicine

## 2021-11-27 ENCOUNTER — Ambulatory Visit (INDEPENDENT_AMBULATORY_CARE_PROVIDER_SITE_OTHER): Payer: Medicaid Other | Admitting: Family Medicine

## 2021-11-27 ENCOUNTER — Other Ambulatory Visit (HOSPITAL_COMMUNITY)
Admission: RE | Admit: 2021-11-27 | Discharge: 2021-11-27 | Disposition: A | Discharge status: Home / Self Care | Payer: Medicaid Other | Source: Ambulatory Visit | Attending: Family Medicine | Admitting: Family Medicine

## 2021-11-27 VITALS — BP 126/76 | HR 118 | Wt 155.2 lb

## 2021-11-27 DIAGNOSIS — A5901 Trichomonal vulvovaginitis: Secondary | ICD-10-CM

## 2021-11-27 DIAGNOSIS — O43199 Other malformation of placenta, unspecified trimester: Secondary | ICD-10-CM

## 2021-11-27 DIAGNOSIS — Z3493 Encounter for supervision of normal pregnancy, unspecified, third trimester: Secondary | ICD-10-CM | POA: Diagnosis not present

## 2021-11-27 DIAGNOSIS — N898 Other specified noninflammatory disorders of vagina: Secondary | ICD-10-CM | POA: Diagnosis present

## 2021-11-27 DIAGNOSIS — L209 Atopic dermatitis, unspecified: Secondary | ICD-10-CM

## 2021-11-27 DIAGNOSIS — R350 Frequency of micturition: Secondary | ICD-10-CM | POA: Diagnosis not present

## 2021-11-27 DIAGNOSIS — N760 Acute vaginitis: Secondary | ICD-10-CM

## 2021-11-27 DIAGNOSIS — O26899 Other specified pregnancy related conditions, unspecified trimester: Secondary | ICD-10-CM

## 2021-11-27 DIAGNOSIS — D563 Thalassemia minor: Secondary | ICD-10-CM

## 2021-11-27 DIAGNOSIS — B9689 Other specified bacterial agents as the cause of diseases classified elsewhere: Secondary | ICD-10-CM

## 2021-11-27 DIAGNOSIS — O23591 Infection of other part of genital tract in pregnancy, first trimester: Secondary | ICD-10-CM

## 2021-11-27 LAB — POCT URINALYSIS DIP (DEVICE)
Bilirubin Urine: NEGATIVE
Glucose, UA: NEGATIVE mg/dL
Hgb urine dipstick: NEGATIVE
Ketones, ur: NEGATIVE mg/dL
Nitrite: NEGATIVE
Protein, ur: NEGATIVE mg/dL
Specific Gravity, Urine: >=1.03 (ref 1.005–1.030)
Urobilinogen, UA: 0.2 mg/dL (ref 0.0–1.0)
pH: 5.5 (ref 5.0–8.0)

## 2021-11-27 NOTE — Progress Notes (Signed)
C/o yellowish vaginal discharge and bad odor. Self swab Wet prep obtained. Also c/o urinary frequency - UA obtained, shows leucocytes-urine culture ordered.  ?Nancy Fetter ?

## 2021-11-27 NOTE — Patient Instructions (Signed)

## 2021-11-27 NOTE — Progress Notes (Signed)
? ? ?  PRENATAL VISIT NOTE ? ?Subjective:  ?Gabrielle Garcia is a 21 y.o. G2P0010 at [redacted]w[redacted]d being seen today for ongoing prenatal care.  She is currently monitored for the following issues for this low-risk pregnancy and has Atopic dermatitis; Supervision of low-risk pregnancy; Asthma; Alpha thalassemia silent carrier; Trichomonal vaginitis during pregnancy; and Marginal insertion of umbilical cord affecting management of mother on their problem list. ? ?Patient reports no complaints.  Contractions: Not present. Vag. Bleeding: None.  Movement: Present. Denies leaking of fluid.  ? ?The following portions of the patient's history were reviewed and updated as appropriate: allergies, current medications, past family history, past medical history, past social history, past surgical history and problem list.  ? ?Objective:  ? ?Vitals:  ? 11/27/21 1437  ?BP: 126/76  ?Pulse: (!) 118  ?Weight: 155 lb 3.2 oz (70.4 kg)  ? ? ?Fetal Status: Fetal Heart Rate (bpm): 135 Fundal Height: 29 cm Movement: Present    ? ?General:  Alert, oriented and cooperative. Patient is in no acute distress.  ?Skin: Skin is warm and dry. No rash noted.   ?Cardiovascular: Normal heart rate noted  ?Respiratory: Normal respiratory effort, no problems with respiration noted  ?Abdomen: Soft, gravid, appropriate for gestational age.  Pain/Pressure: Absent     ?Pelvic: Cervical exam deferred        ?Extremities: Normal range of motion.  Edema: None  ?Mental Status: Normal mood and affect. Normal behavior. Normal judgment and thought content.  ? ?Assessment and Plan:  ?Pregnancy: G2P0010 at [redacted]w[redacted]d ?1. Atopic dermatitis, unspecified type ?Given steroid cream last visit ? ?2. Trichomonal vaginitis during pregnancy in first trimester ?TOC neg ? ?3. Encounter for supervision of low-risk pregnancy in third trimester ? ?Centering Pregnancy, Session#5: Reviewed resources in [employer]. Given Centering water bottle. Made rice socks ? ?Facilitated  discussion today:  Stages of Labor, Sign of labor, labor positions, coping strategies.  ?Reviewed deep relaxation breathing and use of pool noodle on low back for intrapartum massage.  ? ?Fundal height and FHR appropriate today unless noted otherwise in plan. Patient to continue group care.  ? ?Reported vaginal discharge-- swab collected and showed BV- metronidazole was sent in on 11/29/21  ? ?4. Alpha thalassemia silent carrier ? ?5. Marginal insertion of umbilical cord affecting management of mother ?Has growth [place] ? ?Preterm labor symptoms and general obstetric precautions including but not limited to vaginal bleeding, contractions, leaking of fluid and fetal movement were reviewed in detail with the patient. ?Please refer to After Visit Summary for other counseling recommendations.  ? ?Return in about 2 weeks (around 12/11/2021) for Centering Pregnancy. ? ?Future Appointments  ?Date Time Provider Department Center  ?12/03/2021  8:30 AM [facility] NURSE [facility]  ?12/03/2021  8:45 AM [facility] US4 [facility]  ?12/05/2021  9:30 AM [facility] LAB [facility]  ?12/11/2021  2:00 PM CENTERING PROVIDER [facility]  ?12/25/2021  2:00 PM CENTERING PROVIDER [facility]  ?01/08/2022  2:00 PM CENTERING PROVIDER [facility]  ?01/22/2022  2:00 PM CENTERING PROVIDER [facility]  ?02/05/2022  2:00 PM CENTERING PROVIDER [facility]  ? ? ?[name], MD ?

## 2021-11-28 LAB — CERVICOVAGINAL ANCILLARY ONLY
Bacterial Vaginitis (gardnerella): POSITIVE — AB
Candida Glabrata: NEGATIVE
Candida Vaginitis: NEGATIVE
Chlamydia: NEGATIVE
Comment: NEGATIVE
Comment: NEGATIVE
Comment: NEGATIVE
Comment: NEGATIVE
Comment: NEGATIVE
Comment: NORMAL
Neisseria Gonorrhea: NEGATIVE
Trichomonas: NEGATIVE

## 2021-11-29 ENCOUNTER — Encounter: Payer: Self-pay | Admitting: Family Medicine

## 2021-11-29 ENCOUNTER — Encounter: Payer: Self-pay | Admitting: *Deleted

## 2021-11-29 LAB — URINE CULTURE, OB REFLEX

## 2021-11-29 LAB — CULTURE, OB URINE

## 2021-11-29 MED ORDER — METRONIDAZOLE 500 MG PO TABS: 500.0000 mg | ORAL_TABLET | Freq: Two times a day (BID) | ORAL | 0 refills | Status: DC

## 2021-12-03 ENCOUNTER — Ambulatory Visit: Payer: Medicaid Other | Admitting: *Deleted

## 2021-12-03 ENCOUNTER — Encounter: Payer: Self-pay | Admitting: *Deleted

## 2021-12-03 ENCOUNTER — Ambulatory Visit: Payer: Medicaid Other | Attending: Obstetrics

## 2021-12-03 VITALS — BP 120/71 | HR 97

## 2021-12-03 DIAGNOSIS — Z3A30 30 weeks gestation of pregnancy: Secondary | ICD-10-CM | POA: Diagnosis not present

## 2021-12-03 DIAGNOSIS — D563 Thalassemia minor: Secondary | ICD-10-CM | POA: Insufficient documentation

## 2021-12-03 DIAGNOSIS — O285 Abnormal chromosomal and genetic finding on antenatal screening of mother: Secondary | ICD-10-CM

## 2021-12-03 DIAGNOSIS — O43199 Other malformation of placenta, unspecified trimester: Secondary | ICD-10-CM | POA: Diagnosis present

## 2021-12-03 DIAGNOSIS — O43193 Other malformation of placenta, third trimester: Secondary | ICD-10-CM

## 2021-12-05 ENCOUNTER — Other Ambulatory Visit: Payer: Medicaid Other

## 2021-12-05 ENCOUNTER — Other Ambulatory Visit: Payer: Self-pay

## 2021-12-05 DIAGNOSIS — Z3492 Encounter for supervision of normal pregnancy, unspecified, second trimester: Secondary | ICD-10-CM | POA: Diagnosis not present

## 2021-12-05 DIAGNOSIS — Z3493 Encounter for supervision of normal pregnancy, unspecified, third trimester: Secondary | ICD-10-CM

## 2021-12-06 ENCOUNTER — Encounter: Payer: Self-pay | Admitting: Family Medicine

## 2021-12-06 LAB — CBC
Hematocrit: 33.4 % — ABNORMAL LOW (ref 34.0–46.6)
Hemoglobin: 10.9 g/dL — ABNORMAL LOW (ref 11.1–15.9)
MCH: 27.7 pg (ref 26.6–33.0)
MCHC: 32.6 g/dL (ref 31.5–35.7)
MCV: 85 fL (ref 79–97)
Platelets: 244 10*3/uL (ref 150–450)
RBC: 3.93 x10E6/uL (ref 3.77–5.28)
RDW: 14 % (ref 11.7–15.4)
WBC: 10.4 10*3/uL (ref 3.4–10.8)

## 2021-12-06 LAB — HIV ANTIBODY (ROUTINE TESTING W REFLEX): HIV Screen 4th Generation wRfx: NONREACTIVE

## 2021-12-06 LAB — RPR: RPR Ser Ql: NONREACTIVE

## 2021-12-10 ENCOUNTER — Other Ambulatory Visit: Payer: Medicaid Other

## 2021-12-11 ENCOUNTER — Encounter: Payer: Medicaid Other | Admitting: Family Medicine

## 2021-12-25 ENCOUNTER — Encounter: Payer: Self-pay | Admitting: *Deleted

## 2021-12-25 ENCOUNTER — Encounter: Payer: Medicaid Other | Admitting: Family Medicine

## 2021-12-25 NOTE — Progress Notes (Unsigned)
   PRENATAL VISIT NOTE  Subjective:  Gabrielle Garcia is a 21 y.o. G2P0010 at [redacted]w[redacted]d being seen today for ongoing prenatal care.  She is currently monitored for the following issues for this {Blank single:19197::"high-risk","low-risk"} pregnancy and has Atopic dermatitis; Supervision of low-risk pregnancy; Asthma; Alpha thalassemia silent carrier; Trichomonal vaginitis during pregnancy; and Marginal insertion of umbilical cord affecting management of mother on their problem list.  Patient reports {sx:14538}.   .  .   . Denies leaking of fluid.   The following portions of the patient's history were reviewed and updated as appropriate: allergies, current medications, past family history, past medical history, past social history, past surgical history and problem list.   Objective:  There were no vitals filed for this visit.  Fetal Status:           General:  Alert, oriented and cooperative. Patient is in no acute distress.  Skin: Skin is warm and dry. No rash noted.   Cardiovascular: Normal heart rate noted  Respiratory: Normal respiratory effort, no problems with respiration noted  Abdomen: Soft, gravid, appropriate for gestational age.        Pelvic: {Blank single:19197::"Cervical exam performed in the presence of a chaperone","Cervical exam deferred"}        Extremities: Normal range of motion.     Mental Status: Normal mood and affect. Normal behavior. Normal judgment and thought content.   Assessment and Plan:  Pregnancy: G2P0010 at [redacted]w[redacted]d 1. Encounter for supervision of low-risk pregnancy in third trimester ***  2. Marginal insertion of umbilical cord affecting management of mother ***  3. Atopic dermatitis, unspecified type ***  {Blank single:19197::"Term","Preterm"} labor symptoms and general obstetric precautions including but not limited to vaginal bleeding, contractions, leaking of fluid and fetal movement were reviewed in detail with the patient. Please refer to  After Visit Summary for other counseling recommendations.   No follow-ups on file.  Future Appointments  Date Time Provider [place]  12/25/2021  2:00 PM Caren Macadam, MD [facility]  01/08/2022  2:00 PM CENTERING PROVIDER [facility]  01/22/2022  2:00 PM CENTERING PROVIDER [facility]  02/05/2022  2:00 PM CENTERING PROVIDER [facility]    Caren Macadam, MD

## 2021-12-27 ENCOUNTER — Telehealth: Payer: Self-pay | Admitting: Family Medicine

## 2021-12-27 NOTE — Telephone Encounter (Signed)
Called patient to schedule appointment, there was no answer to the phone call so a voicemail was left with the call back number for the office.

## 2021-12-31 ENCOUNTER — Encounter: Payer: Medicaid Other | Admitting: Family Medicine

## 2022-01-03 ENCOUNTER — Telehealth: Payer: Self-pay | Admitting: Family Medicine

## 2022-01-03 ENCOUNTER — Other Ambulatory Visit: Payer: Self-pay

## 2022-01-03 NOTE — Telephone Encounter (Signed)
Patient calling requesting a Breast Pump,  and said she ned to get an Ultra Sound said she suppose it get one every 6 weeks.

## 2022-01-06 NOTE — Telephone Encounter (Signed)
Call placed to pt. No answer, left VM for pt with detailed message about breast pump orders that were sent to [employer] and number given to pt to scheduled [place].  Will send mychart message as well.  Judeth Cornfield, RN

## 2022-01-08 ENCOUNTER — Encounter: Payer: Medicaid Other | Admitting: Family Medicine

## 2022-01-08 ENCOUNTER — Encounter: Payer: Self-pay | Admitting: *Deleted

## 2022-01-09 ENCOUNTER — Telehealth: Payer: Self-pay | Admitting: *Deleted

## 2022-01-09 NOTE — Telephone Encounter (Signed)
Gabrielle Garcia missed CenteringPregnancy prenatal appointment yesterday. She has now missed 3 [facility]eringPregnancy prenatal appointments. She has not read MyChart message I sent. I called Gabrielle Garcia to ask how she is doing. She confirms she and baby are doing well and baby is moving good. She apologized for missing appointments, states she just couldn't make it. We discussed she has a already scheduled ob make up appointment on 6/14 and next Centering 01/22/22. She states she definitely plans to make both of those appointments. We discussed it is important to come to her appointments because it is time for her cultures and need bp check. I encouraged her to let us know if she can't make appointments because we can schedule a nurse visit. She voices understanding. Gabrielle Ptacek,RN

## 2022-01-15 ENCOUNTER — Ambulatory Visit (INDEPENDENT_AMBULATORY_CARE_PROVIDER_SITE_OTHER): Payer: Medicaid Other | Admitting: Family Medicine

## 2022-01-15 ENCOUNTER — Other Ambulatory Visit (HOSPITAL_COMMUNITY)
Admission: RE | Admit: 2022-01-15 | Discharge: 2022-01-15 | Disposition: A | Discharge status: Home / Self Care | Payer: Medicaid Other | Source: Ambulatory Visit | Attending: Family Medicine | Admitting: Family Medicine

## 2022-01-15 ENCOUNTER — Encounter: Payer: Self-pay | Admitting: Family Medicine

## 2022-01-15 VITALS — BP 111/70 | HR 113 | Wt 173.7 lb

## 2022-01-15 DIAGNOSIS — Z3493 Encounter for supervision of normal pregnancy, unspecified, third trimester: Secondary | ICD-10-CM | POA: Insufficient documentation

## 2022-01-15 DIAGNOSIS — O43199 Other malformation of placenta, unspecified trimester: Secondary | ICD-10-CM

## 2022-01-15 NOTE — Progress Notes (Signed)
U/S scheduled for growth on June 16th @ 1530.  Pt notified .    Gabrielle Garcia  01/15/22

## 2022-01-15 NOTE — Progress Notes (Signed)
   Subjective:  Gabrielle Garcia is a 21 y.o. G2P0010 at [redacted]w[redacted]d being seen today for ongoing prenatal care.  She is currently monitored for the following issues for this low-risk pregnancy and has Atopic dermatitis; Supervision of low-risk pregnancy; Asthma; Alpha thalassemia silent carrier; Trichomonal vaginitis during pregnancy; and Marginal insertion of umbilical cord affecting management of mother on their problem list.  Patient reports no complaints.  Contractions: Not present. Vag. Bleeding: None.  Movement: Present. Denies leaking of fluid.   The following portions of the patient's history were reviewed and updated as appropriate: allergies, current medications, past family history, past medical history, past social history, past surgical history and problem list. Problem list updated.  Objective:   Vitals:   01/15/22 1404  BP: 111/70  Pulse: (!) 113  Weight: 173 lb 11.2 oz (78.8 kg)    Fetal Status: Fetal Heart Rate (bpm): 149   Movement: Present     General:  Alert, oriented and cooperative. Patient is in no acute distress.  Skin: Skin is warm and dry. No rash noted.   Cardiovascular: Normal heart rate noted  Respiratory: Normal respiratory effort, no problems with respiration noted  Abdomen: Soft, gravid, appropriate for gestational age. Pain/Pressure: Present     Pelvic: Vag. Bleeding: None     Cervical exam deferred        Extremities: Normal range of motion.  Edema: None  Mental Status: Normal mood and affect. Normal behavior. Normal judgment and thought content.   Urinalysis:      Assessment and Plan:  Pregnancy: G2P0010 at [redacted]w[redacted]d  1. Encounter for supervision of low-risk pregnancy in third trimester BP and FHR normal Swabs today Confirmed vertex by bedside US On review of chart patient never completed third trimester GDM screen Discussed options of A1c vs 1hr GTT, she prefers A1c, collected today - Culture, beta strep (group b only) - GC/Chlamydia probe  amp ([place])not at [facility]  2. Marginal insertion of umbilical cord Due for follow up growth [place], will try to coordinate  Preterm labor symptoms and general obstetric precautions including but not limited to vaginal bleeding, contractions, leaking of fluid and fetal movement were reviewed in detail with the patient. Please refer to After Visit Summary for other counseling recommendations.  Return in 1 week (on 01/22/2022) for [facility], ob visit.   Venora Maples, MD

## 2022-01-15 NOTE — Patient Instructions (Signed)

## 2022-01-16 LAB — GC/CHLAMYDIA PROBE AMP (~~LOC~~) NOT AT ARMC
Chlamydia: NEGATIVE
Comment: NEGATIVE
Comment: NORMAL
Neisseria Gonorrhea: NEGATIVE

## 2022-01-16 LAB — HEMOGLOBIN A1C
Est. average glucose Bld gHb Est-mCnc: 100 mg/dL
Hgb A1c MFr Bld: 5.1 % (ref 4.8–5.6)

## 2022-01-17 ENCOUNTER — Ambulatory Visit: Payer: Medicaid Other | Attending: Family Medicine

## 2022-01-17 ENCOUNTER — Encounter: Payer: Self-pay | Admitting: *Deleted

## 2022-01-17 ENCOUNTER — Ambulatory Visit: Payer: Medicaid Other | Admitting: *Deleted

## 2022-01-17 VITALS — BP 129/76 | HR 112

## 2022-01-17 DIAGNOSIS — Z3A36 36 weeks gestation of pregnancy: Secondary | ICD-10-CM | POA: Diagnosis not present

## 2022-01-17 DIAGNOSIS — Z3493 Encounter for supervision of normal pregnancy, unspecified, third trimester: Secondary | ICD-10-CM | POA: Insufficient documentation

## 2022-01-17 DIAGNOSIS — J4520 Mild intermittent asthma, uncomplicated: Secondary | ICD-10-CM | POA: Insufficient documentation

## 2022-01-17 DIAGNOSIS — O43199 Other malformation of placenta, unspecified trimester: Secondary | ICD-10-CM

## 2022-01-17 DIAGNOSIS — Z362 Encounter for other antenatal screening follow-up: Secondary | ICD-10-CM

## 2022-01-17 DIAGNOSIS — O43193 Other malformation of placenta, third trimester: Secondary | ICD-10-CM | POA: Diagnosis present

## 2022-01-17 DIAGNOSIS — D563 Thalassemia minor: Secondary | ICD-10-CM | POA: Insufficient documentation

## 2022-01-18 LAB — CULTURE, BETA STREP (GROUP B ONLY): Strep Gp B Culture: NEGATIVE

## 2022-01-22 ENCOUNTER — Telehealth: Payer: Self-pay | Admitting: *Deleted

## 2022-01-22 ENCOUNTER — Encounter: Payer: Medicaid Other | Admitting: Family Medicine

## 2022-01-22 NOTE — Telephone Encounter (Signed)
[facility]efah [employer] [facility]regnancy prenatal visit today. I called to follow up with her. She states she has been having car trouble but got it fixed today.She states she is doing well. I reviewed her ob fu appointment for 01/29/22 and CenteringPregnancy prenatal visit 02/12/22. She states she plans to atttend her ob appointment next week. Nancy Fetter

## 2022-01-29 ENCOUNTER — Other Ambulatory Visit: Payer: Self-pay

## 2022-01-29 ENCOUNTER — Ambulatory Visit (INDEPENDENT_AMBULATORY_CARE_PROVIDER_SITE_OTHER): Payer: Medicaid Other | Admitting: Family Medicine

## 2022-01-29 VITALS — BP 128/80 | HR 121 | Wt 177.0 lb

## 2022-01-29 DIAGNOSIS — O43199 Other malformation of placenta, unspecified trimester: Secondary | ICD-10-CM

## 2022-01-29 DIAGNOSIS — Z3493 Encounter for supervision of normal pregnancy, unspecified, third trimester: Secondary | ICD-10-CM

## 2022-01-29 DIAGNOSIS — Z3A38 38 weeks gestation of pregnancy: Secondary | ICD-10-CM

## 2022-01-29 DIAGNOSIS — D563 Thalassemia minor: Secondary | ICD-10-CM

## 2022-01-29 NOTE — Progress Notes (Signed)
   PRENATAL VISIT NOTE  Subjective:  Gabrielle Garcia is a 21 y.o. G2P0010 at [redacted]w[redacted]d being seen today for ongoing prenatal care.  She is currently monitored for the following issues for this high-risk pregnancy and has Atopic dermatitis; Supervision of low-risk pregnancy; Asthma; Alpha thalassemia silent carrier; and Marginal insertion of umbilical cord affecting management of mother on their problem list.  Patient reports no complaints.  Contractions: Not present. Vag. Bleeding: None.  Movement: Present. Denies leaking of fluid.   The following portions of the patient's history were reviewed and updated as appropriate: allergies, current medications, past family history, past medical history, past social history, past surgical history and problem list.   Objective:   Vitals:   01/29/22 1359  BP: 128/80  Pulse: (!) 121  Weight: 177 lb (80.3 kg)    Fetal Status: Fetal Heart Rate (bpm): 131 Fundal Height: 38 cm Movement: Present  Presentation: Vertex  General:  Alert, oriented and cooperative. Patient is in no acute distress.  Skin: Skin is warm and dry. No rash noted.   Cardiovascular: Normal heart rate noted  Respiratory: Normal respiratory effort, no problems with respiration noted  Abdomen: Soft, gravid, appropriate for gestational age.  Pain/Pressure: Present     Pelvic: Cervical exam performed in the presence of a chaperone Dilation: Fingertip Effacement (%): Thick Station: -3  Extremities: Normal range of motion.  Edema: None  Mental Status: Normal mood and affect. Normal behavior. Normal judgment and thought content.   Assessment and Plan:  Pregnancy: G2P0010 at [redacted]w[redacted]d 1. Encounter for supervision of low-risk pregnancy in third trimester FHT and FH normal  2. Marginal insertion of umbilical cord affecting management of mother Good growth  3. Alpha thalassemia silent carrier   Term labor symptoms and general obstetric precautions including but not limited to vaginal  bleeding, contractions, leaking of fluid and fetal movement were reviewed in detail with the patient. Please refer to After Visit Summary for other counseling recommendations.   No follow-ups on file.  Future Appointments  Date Time Provider Department Center  02/12/2022  2:00 PM CENTERING PROVIDER [facility]    Levie Heritage, DO

## 2022-01-30 ENCOUNTER — Encounter: Payer: Medicaid Other | Admitting: Family Medicine

## 2022-02-05 ENCOUNTER — Encounter: Payer: Self-pay | Admitting: *Deleted

## 2022-02-05 ENCOUNTER — Other Ambulatory Visit: Payer: Self-pay

## 2022-02-05 ENCOUNTER — Ambulatory Visit (INDEPENDENT_AMBULATORY_CARE_PROVIDER_SITE_OTHER): Payer: Medicaid Other | Admitting: Family Medicine

## 2022-02-05 VITALS — BP 133/86 | HR 106 | Wt 183.2 lb

## 2022-02-05 DIAGNOSIS — O43199 Other malformation of placenta, unspecified trimester: Secondary | ICD-10-CM

## 2022-02-05 DIAGNOSIS — Z3493 Encounter for supervision of normal pregnancy, unspecified, third trimester: Secondary | ICD-10-CM

## 2022-02-05 DIAGNOSIS — Z3A39 39 weeks gestation of pregnancy: Secondary | ICD-10-CM

## 2022-02-05 NOTE — Progress Notes (Signed)
IOL scheduled for 02/19/22 in the am.  Pt notified.    Gabrielle Garcia  02/05/22

## 2022-02-05 NOTE — Progress Notes (Signed)
   PRENATAL VISIT NOTE  Subjective:  Gabrielle Garcia is a 21 y.o. G2P0010 at [redacted]w[redacted]d being seen today for ongoing prenatal care.  She is currently monitored for the following issues for this low-risk pregnancy and has Atopic dermatitis; Supervision of low-risk pregnancy; Asthma; Alpha thalassemia silent carrier; and Marginal insertion of umbilical cord affecting management of mother on their problem list.  Patient reports occasional contractions.  Contractions: Irritability. Vag. Bleeding: None.  Movement: Present. Denies leaking of fluid.   The following portions of the patient's history were reviewed and updated as appropriate: allergies, current medications, past family history, past medical history, past social history, past surgical history and problem list.   Objective:   Vitals:   02/05/22 1613  BP: 133/86  Pulse: (!) 106  Weight: 183 lb 3.2 oz (83.1 kg)    Fetal Status: Fetal Heart Rate (bpm): 134   Movement: Present     General:  Alert, oriented and cooperative. Patient is in no acute distress.  Skin: Skin is warm and dry. No rash noted.   Cardiovascular: Normal heart rate noted  Respiratory: Normal respiratory effort, no problems with respiration noted  Abdomen: Soft, gravid, appropriate for gestational age.  Pain/Pressure: Present     Pelvic: Cervical exam deferred        Extremities: Normal range of motion.  Edema: Trace  Mental Status: Normal mood and affect. Normal behavior. Normal judgment and thought content.   Assessment and Plan:  Pregnancy: G2P0010 at [redacted]w[redacted]d 1. Encounter for supervision of low-risk pregnancy in third trimester FHT and FH normal BPP next appt [place] at [redacted]w[redacted]d  2. Marginal insertion of umbilical cord affecting management of mother Normal growth  Term labor symptoms and general obstetric precautions including but not limited to vaginal bleeding, contractions, leaking of fluid and fetal movement were reviewed in detail with the  patient. Please refer to After Visit Summary for other counseling recommendations.   Return in about 1 week (around 02/12/2022) for LR OB f/u, NST/BPP.  No future appointments.  Levie Heritage, DO

## 2022-02-12 ENCOUNTER — Other Ambulatory Visit: Payer: Self-pay | Admitting: Advanced Practice Midwife

## 2022-02-12 DIAGNOSIS — O480 Post-term pregnancy: Secondary | ICD-10-CM

## 2022-02-13 ENCOUNTER — Encounter (HOSPITAL_COMMUNITY): Payer: Self-pay | Admitting: Obstetrics and Gynecology

## 2022-02-13 ENCOUNTER — Other Ambulatory Visit: Payer: Self-pay

## 2022-02-13 ENCOUNTER — Other Ambulatory Visit: Payer: Medicaid Other

## 2022-02-13 ENCOUNTER — Inpatient Hospital Stay (HOSPITAL_COMMUNITY)
Admission: AD | Admit: 2022-02-13 | Discharge: 2022-02-17 | DRG: 807 | Disposition: A | Discharge status: Home / Self Care | Payer: Medicaid Other | Attending: Obstetrics & Gynecology | Admitting: Obstetrics & Gynecology

## 2022-02-13 DIAGNOSIS — J45909 Unspecified asthma, uncomplicated: Secondary | ICD-10-CM | POA: Diagnosis present

## 2022-02-13 DIAGNOSIS — O43123 Velamentous insertion of umbilical cord, third trimester: Secondary | ICD-10-CM | POA: Diagnosis present

## 2022-02-13 DIAGNOSIS — O480 Post-term pregnancy: Secondary | ICD-10-CM | POA: Diagnosis present

## 2022-02-13 DIAGNOSIS — Z87891 Personal history of nicotine dependence: Secondary | ICD-10-CM | POA: Diagnosis not present

## 2022-02-13 DIAGNOSIS — O134 Gestational [pregnancy-induced] hypertension without significant proteinuria, complicating childbirth: Secondary | ICD-10-CM | POA: Diagnosis present

## 2022-02-13 DIAGNOSIS — Z3A40 40 weeks gestation of pregnancy: Secondary | ICD-10-CM

## 2022-02-13 DIAGNOSIS — Z3490 Encounter for supervision of normal pregnancy, unspecified, unspecified trimester: Secondary | ICD-10-CM

## 2022-02-13 DIAGNOSIS — O9952 Diseases of the respiratory system complicating childbirth: Secondary | ICD-10-CM | POA: Diagnosis present

## 2022-02-13 DIAGNOSIS — Z3A41 41 weeks gestation of pregnancy: Secondary | ICD-10-CM | POA: Diagnosis not present

## 2022-02-13 DIAGNOSIS — O43199 Other malformation of placenta, unspecified trimester: Secondary | ICD-10-CM | POA: Diagnosis present

## 2022-02-13 DIAGNOSIS — D563 Thalassemia minor: Secondary | ICD-10-CM | POA: Diagnosis present

## 2022-02-13 LAB — CBC
HCT: 38.9 % (ref 36.0–46.0)
Hemoglobin: 12.8 g/dL (ref 12.0–15.0)
MCH: 27.8 pg (ref 26.0–34.0)
MCHC: 32.9 g/dL (ref 30.0–36.0)
MCV: 84.6 fL (ref 80.0–100.0)
Platelets: 259 10*3/uL (ref 150–400)
RBC: 4.6 MIL/uL (ref 3.87–5.11)
RDW: 13.7 % (ref 11.5–15.5)
WBC: 10.3 10*3/uL (ref 4.0–10.5)
nRBC: 0 % (ref 0.0–0.2)

## 2022-02-13 MED ORDER — MISOPROSTOL 25 MCG QUARTER TABLET: 25.0000 ug | ORAL_TABLET | ORAL | Status: DC | PRN

## 2022-02-13 MED ORDER — ACETAMINOPHEN 325 MG PO TABS: 650.0000 mg | ORAL_TABLET | ORAL | Status: DC | PRN

## 2022-02-13 MED ORDER — MISOPROSTOL 50MCG HALF TABLET
50.0000 ug | ORAL_TABLET | Freq: Once | ORAL | Status: AC
  Administered 2022-02-13: 50 ug via BUCCAL
  Filled 2022-02-13: qty 1

## 2022-02-13 MED ORDER — OXYTOCIN BOLUS FROM INFUSION
333.0000 mL | Freq: Once | INTRAVENOUS | Status: AC
  Administered 2022-02-15: 333 mL via INTRAVENOUS

## 2022-02-13 MED ORDER — LACTATED RINGERS IV SOLN: 500.0000 mL | INTRAVENOUS | Status: DC | PRN

## 2022-02-13 MED ORDER — OXYCODONE-ACETAMINOPHEN 5-325 MG PO TABS: 2.0000 | ORAL_TABLET | ORAL | Status: DC | PRN

## 2022-02-13 MED ORDER — OXYTOCIN-SODIUM CHLORIDE 30-0.9 UT/500ML-% IV SOLN
2.5000 [IU]/h | INTRAVENOUS | Status: DC
  Filled 2022-02-13: qty 500

## 2022-02-13 MED ORDER — MISOPROSTOL 50MCG HALF TABLET
50.0000 ug | ORAL_TABLET | ORAL | Status: DC | PRN
Start: 2022-02-13 — End: 2022-02-15
  Administered 2022-02-13: 50 ug via BUCCAL
  Filled 2022-02-13: qty 1

## 2022-02-13 MED ORDER — ONDANSETRON HCL 4 MG/2ML IJ SOLN: 4.0000 mg | Freq: Four times a day (QID) | INTRAMUSCULAR | Status: DC | PRN

## 2022-02-13 MED ORDER — LIDOCAINE HCL (PF) 1 % IJ SOLN: 30.0000 mL | INTRAMUSCULAR | Status: DC | PRN

## 2022-02-13 MED ORDER — LACTATED RINGERS IV SOLN: INTRAVENOUS | Status: DC

## 2022-02-13 MED ORDER — OXYCODONE-ACETAMINOPHEN 5-325 MG PO TABS: 1.0000 | ORAL_TABLET | ORAL | Status: DC | PRN

## 2022-02-13 MED ORDER — FENTANYL CITRATE (PF) 100 MCG/2ML IJ SOLN
100.0000 ug | INTRAMUSCULAR | Status: DC | PRN
  Administered 2022-02-14 (×8): 100 ug via INTRAVENOUS
  Filled 2022-02-13 (×8): qty 2

## 2022-02-13 MED ORDER — SOD CITRATE-CITRIC ACID 500-334 MG/5ML PO SOLN: 30.0000 mL | ORAL | Status: DC | PRN

## 2022-02-13 MED ORDER — TERBUTALINE SULFATE 1 MG/ML IJ SOLN: 0.2500 mg | Freq: Once | INTRAMUSCULAR | Status: DC | PRN

## 2022-02-13 NOTE — Progress Notes (Signed)
Pt does not want a foley balloon.  Had 1st cytotec at 1900, contracting mildly.  FHR Cat 1, moderate variability, + accels at times, no decels.  Cx 1.5/70/-3.  2nd cytotec given.

## 2022-02-13 NOTE — H&P (Addendum)
OBSTETRIC ADMISSION HISTORY AND PHYSICAL  Gabrielle Garcia is a 21 y.o. female G2P0010 with IUP at 10w5dpresenting for labor check and found to have non-reactive NST. She reports +FMs. No LOF, VB, blurry vision, headaches, peripheral edema, or RUQ pain. She plans on breastfeeding. She requests Depo for birth control.  Dating: By LMP --->  Estimated Date of Delivery: 02/08/22  Sono:    @[redacted]w[redacted]d , normal anatomy, cephalic presentation, [id number] 63%ile, EFW 6'14   Prenatal History/Complications: -marginal CI -alpha thal carrier -asthma  Past Medical History: Past Medical History:  Diagnosis Date   Asthma    Eczema    Hx of chlamydia infection 2020   Hx of trichomoniasis     Past Surgical History: Past Surgical History:  Procedure Laterality Date   WISDOM TOOTH EXTRACTION      Obstetrical History: OB History     Gravida  2   Para  0   Term  0   Preterm  0   AB  1   Living  0      SAB  1   IAB  0   Ectopic  0   Multiple  0   Live Births  0           Social History: Social History   Socioeconomic History   Marital status: Single    Spouse name: Not on file   Number of children: Not on file   Years of education: Not on file   Highest education level: Not on file  Occupational History   Not on file  Tobacco Use   Smoking status: Former    Types: Cigars   Smokeless tobacco: Never  Vaping Use   Vaping Use: Never used  Substance and Sexual Activity   Alcohol use: No   Drug use: No   Sexual activity: Not Currently    Birth control/protection: None  Other Topics Concern   Not on file  Social History Narrative   ** Merged History Encounter **       Social Determinants of Health   Financial Resource Strain: Not on file  Food Insecurity: Food Insecurity Present (11/13/2021)   Hunger Vital Sign    Worried About Running Out of Food in the Last Year: Sometimes true    Ran Out of Food in the Last Year: Sometimes true  Transportation Needs:  No Transportation Needs (11/13/2021)   PRAPARE - THydrologist(Medical): No    Lack of Transportation (Non-Medical): No  Physical Activity: Not on file  Stress: Not on file  Social Connections: Not on file    Family History: Family History  Problem Relation Age of Onset   Miscarriages / Stillbirths Mother    Hypertension Mother    Eczema Mother    Urticaria Mother    Hypertension Father    Asthma Brother    Hypertension Other    Diabetes Other    Allergic rhinitis Neg Hx    Angioedema Neg Hx    Atopy Neg Hx    Immunodeficiency Neg Hx     Allergies: Allergies  Allergen Reactions   Benadryl [Diphenhydramine] Itching and Swelling   Pineapple Itching and Swelling    Medications Prior to Admission  Medication Sig Dispense Refill Last Dose   clobetasol cream (TEMOVATE) 06.83% Apply 1 application. topically 2 (two) times daily. Apply to affected area 30 g 2 02/13/2022   Prenatal 28-0.8 MG TABS Take 1 tablet by mouth daily. 30 tablet  12 02/13/2022   triamcinolone ointment (KENALOG) 0.5 % Apply 1 application. topically 2 (two) times daily. 30 g 6 02/13/2022   albuterol (VENTOLIN HFA) 108 (90 Base) MCG/ACT inhaler Inhale 2 puffs into the lungs every 4 (four) hours as needed for wheezing or shortness of breath (or cough). (Patient not taking: Reported on 01/15/2022) 8 g 3    Blood Pressure Monitoring (BLOOD PRESSURE KIT) DEVI 1 Device by Does not apply route as needed. (Patient not taking: Reported on 01/15/2022) 1 each 0     Review of Systems:  All systems reviewed and negative except as stated in HPI  PE: Blood pressure 126/83, pulse (!) 103, temperature 98.3 F (36.8 C), temperature source Oral, resp. rate 18, height 5' 4"  (1.626 m), weight 83 kg, last menstrual period 05/04/2021, SpO2 99 %. General appearance: alert, cooperative, and no distress Lungs: regular rate and effort Heart: regular rate  Abdomen: soft, non-tender Extremities: Homans sign is  negative, no sign of DVT Presentation: cephalic EFM: 591 bpm, mod variability, no accels, no decels Toco: rare Dilation: 1.5 Effacement (%): 20 Station: -3 Exam by:: Herb Grays, RN  Prenatal labs: ABO, Rh: A/Positive/-- (12/28 1146) Antibody: Negative (12/28 1146) Rubella: 3.64 (12/28 1146) RPR: Non Reactive (05/04 0931)  HBsAg: Negative (12/28 1146)  HIV: Non Reactive (05/04 0931)  GBS: Negative/-- (06/14 1443)  2 hr GTT not done; A1C 5.1  Prenatal Transfer Tool  Maternal Diabetes: No Genetic Screening: Normal Maternal Ultrasounds/Referrals: Normal, marginal CI Fetal Ultrasounds or other Referrals:  None Maternal Substance Abuse:  No Significant Maternal Medications:  None Significant Maternal Lab Results: Group B Strep negative  No results found for this or any previous visit (from the past 24 hour(s)).  Patient Active Problem List   Diagnosis Date Noted   Marginal insertion of umbilical cord affecting management of mother 09/17/2021   Alpha thalassemia silent carrier 08/15/2021   Supervision of low-risk pregnancy 07/16/2021   Asthma    Atopic dermatitis 12/20/2012    Assessment: Gabrielle Garcia is a 21 y.o. G2P0010 at 68w5dhere for non-reactive NST  1. Labor: latent 2. FWB: Cat I 3. Pain: analgesia/anesthesia prn 4. GBS: neg   Plan: Admit to LD IOL Labor team notified  MJulianne Handler CNM  02/13/2022, 4:22 PM

## 2022-02-13 NOTE — MAU Note (Signed)
.  Gabrielle Garcia is a 21 y.o. at [redacted]w[redacted]d here in MAU reporting: ctx (7/10) occurring every 10 minutes since 1100 today. Denies VB or LOF. Reports good FM. Denies any other complaints.   No recent cervical exams.    Onset of complaint: 1100 Pain score: 7/10 Vitals:   02/13/22 1350  BP: 130/82  Pulse: (!) 104  Resp: 18  Temp: 98.3 F (36.8 C)  SpO2: 99%     FHT:155 Lab orders placed from triage:  mau labor

## 2022-02-14 ENCOUNTER — Inpatient Hospital Stay (HOSPITAL_COMMUNITY): Payer: Medicaid Other | Admitting: Anesthesiology

## 2022-02-14 LAB — RPR: RPR Ser Ql: NONREACTIVE

## 2022-02-14 LAB — ABO/RH: ABO/RH(D): A POS

## 2022-02-14 MED ORDER — PHENYLEPHRINE 80 MCG/ML (10ML) SYRINGE FOR IV PUSH (FOR BLOOD PRESSURE SUPPORT): 80.0000 ug | PREFILLED_SYRINGE | INTRAVENOUS | Status: DC | PRN

## 2022-02-14 MED ORDER — EPHEDRINE 5 MG/ML INJ: 10.0000 mg | INTRAVENOUS | Status: DC | PRN

## 2022-02-14 MED ORDER — TERBUTALINE SULFATE 1 MG/ML IJ SOLN: 0.2500 mg | Freq: Once | INTRAMUSCULAR | Status: DC | PRN

## 2022-02-14 MED ORDER — FENTANYL-BUPIVACAINE-NACL 0.5-0.125-0.9 MG/250ML-% EP SOLN
12.0000 mL/h | EPIDURAL | Status: DC | PRN
  Filled 2022-02-14: qty 250

## 2022-02-14 MED ORDER — LIDOCAINE HCL (PF) 1 % IJ SOLN
INTRAMUSCULAR | Status: DC | PRN
  Administered 2022-02-14: 5 mL via EPIDURAL

## 2022-02-14 MED ORDER — OXYTOCIN-SODIUM CHLORIDE 30-0.9 UT/500ML-% IV SOLN
1.0000 m[IU]/min | INTRAVENOUS | Status: DC
  Administered 2022-02-14 (×2): 2 m[IU]/min via INTRAVENOUS

## 2022-02-14 MED ORDER — FENTANYL-BUPIVACAINE-NACL 0.5-0.125-0.9 MG/250ML-% EP SOLN
EPIDURAL | Status: DC | PRN
  Administered 2022-02-14: 12 mL/h via EPIDURAL

## 2022-02-14 MED ORDER — LACTATED RINGERS IV SOLN: 500.0000 mL | Freq: Once | INTRAVENOUS | Status: DC

## 2022-02-14 NOTE — Progress Notes (Signed)
Cx now 3.5/90/-2 per RN. Ctx much stronger, good pattern w/ctx q 2 minutes. FHR Cat 1.  Will expectantly manage now, consider pitocin if ctx slack off.

## 2022-02-14 NOTE — Anesthesia Preprocedure Evaluation (Signed)
Anesthesia Evaluation  Patient identified by MRN, date of birth, ID band Patient awake    Reviewed: Allergy & Precautions, NPO status , Patient's Chart, lab work & pertinent test results  Airway Mallampati: II  TM Distance: >3 FB Neck ROM: Full    Dental no notable dental hx. (+) Teeth Intact, Dental Advisory Given   Pulmonary asthma , former smoker,    Pulmonary exam normal breath sounds clear to auscultation       Cardiovascular Exercise Tolerance: Good Normal cardiovascular exam Rhythm:Regular Rate:Normal     Neuro/Psych negative neurological ROS  negative psych ROS   GI/Hepatic negative GI ROS, Neg liver ROS,   Endo/Other  negative endocrine ROS  Renal/GU negative Renal ROS     Musculoskeletal   Abdominal   Peds  Hematology Lab Results      Component                Value               Date                           HGB                      12.8                02/13/2022                HCT                      38.9                02/13/2022                PLT                      259                 02/13/2022              Anesthesia Other Findings   Reproductive/Obstetrics (+) Pregnancy                             Anesthesia Physical Anesthesia Plan  ASA: 2  Anesthesia Plan: Epidural   Post-op Pain Management:    Induction:   PONV Risk Score and Plan:   Airway Management Planned:   Additional Equipment:   Intra-op Plan:   Post-operative Plan:   Informed Consent: I have reviewed the patients History and Physical, chart, labs and discussed the procedure including the risks, benefits and alternatives for the proposed anesthesia with the patient or authorized representative who has indicated his/her understanding and acceptance.       Plan Discussed with:   Anesthesia Plan Comments: (40.6 Wk G2P0 w  Hx of Athma for LEA)        Anesthesia Quick Evaluation

## 2022-02-14 NOTE — Progress Notes (Signed)
Labor Progress Note Micalah Venise Ellingwood is a 21 y.o. G2P0010 at [redacted]w[redacted]d who presented for IOL due to NRNST at term.  S: Resting comfortably s/p epidural. No concerns per family at bedside.   O:  BP 116/70   Pulse 91   Temp 98 F (36.7 C) (Oral)   Resp 17   Ht 5\' 4"  (1.626 m)   Wt 83 kg   LMP 05/04/2021   SpO2 100%   BMI 31.41 kg/m   EFM: Baseline 145 bpm, min to mod variability, + accels, early decels  Toco: Every 2-5 minutes   CVE: Dilation: 7.5 Effacement (%): 90 Cervical Position: Posterior Station: -2 Presentation: Vertex Exam by:: [email or web address], RN  A&P: 21 y.o. G2P0010 [redacted]w[redacted]d   #Labor: Progressing well. Pitocin previously discontinued prior to epidural placement. Restarted at 2142. Will plan to reassess in 2 hours, sooner as needed.  #Pain: Epidural  #FWB: Cat 2 due to intermittent periods of minimal variability. Continues to have accels and moderate variability in between. Will continue to monitor closely.  #GBS negative  2143, MD 10:39 PM

## 2022-02-14 NOTE — Progress Notes (Signed)
Gabrielle Garcia is a 21 y.o. G2P0010 at [redacted]w[redacted]d admitted for IOL in the setting of non-reactive NST at term.  Subjective: Gabrielle Garcia is coping with contractions. She has had several doses of IV fentanyl which she reports helps pain some. She is planning on not having an epidural.   Objective: BP 136/81   Pulse 99   Temp 98.3 F (36.8 C) (Oral)   Resp 16   Ht 5\' 4"  (1.626 m)   Wt 83 kg   LMP 05/04/2021   SpO2 99%   BMI 31.41 kg/m  No intake/output data recorded. No intake/output data recorded.  FHT: 135 bpm, minimal variability, +10x10 accels, +early decels UC:  Q 3-3mins SVE:   Dilation: 6.5 Effacement (%): 100 Station: -2, -3 Exam by:: [email or web address], CNM  Labs: Lab Results  Component Value Date   WBC 10.3 02/13/2022   HGB 12.8 02/13/2022   HCT 38.9 02/13/2022   MCV 84.6 02/13/2022   PLT 259 02/13/2022    Assessment / Plan: Gabrielle Garcia is a 21 y.o. G2P0010 at [redacted]w[redacted]d admitted for IOL in the setting of non-reactive NST  Labor: Minimal change since last cervical exam. Discussed starting low dose Pitocin with patient. She agrees with plan of care.  Fetal Wellbeing:  Overall reassuring. Some periods of minimal variability  Pain Control:  IV pain meds I/D:  GBS negative Anticipated MOD:  NSVD   [redacted]w[redacted]d, CNM 02/14/2022, 4:30 PM

## 2022-02-14 NOTE — Progress Notes (Signed)
Gabrielle Garcia is a 21 y.o. G2P0010 at [redacted]w[redacted]d admitted for IOL in the setting of non-reactive NST at term.  Subjective: Gabrielle Garcia is coping with contractions. She has had several doses of IV fentanyl which she reports helps pain some. She is planning on not having an epidural.   Objective: BP 134/80   Pulse 90   Temp 98.2 F (36.8 C) (Oral)   Resp 18   Ht 5\' 4"  (1.626 m)   Wt 83 kg   LMP 05/04/2021   SpO2 99%   BMI 31.41 kg/m  No intake/output data recorded. No intake/output data recorded.  FHT: 125 bpm, minimal variability, +10x10 accels, +early decels UC:  Q 2-80mins SVE:   Dilation: 6 Effacement (%): 100 Station: -2 Exam by:: [email or web address], RNC  Labs: Lab Results  Component Value Date   WBC 10.3 02/13/2022   HGB 12.8 02/13/2022   HCT 38.9 02/13/2022   MCV 84.6 02/13/2022   PLT 259 02/13/2022    Assessment / Plan: Gabrielle Garcia is a 21 y.o. G2P0010 at [redacted]w[redacted]d admitted for IOL in the setting of non-reactive NST  Labor: Progressing s/p 2 doses of cytotec. She SROM'd at 1000 with clear fluid. Contracting painfully ever 2-4 mins. Will reassess in 2 hours or sooner prn.  Fetal Wellbeing:  Overall reassuring. Some periods of minimal variability  Pain Control:  IV pain meds I/D:  GBS negative Anticipated MOD:  NSVD   [redacted]w[redacted]d, CNM 02/14/2022, 11:20 AM

## 2022-02-14 NOTE — Anesthesia Procedure Notes (Signed)
Epidural Patient location during procedure: OB Start time: 02/14/2022 7:24 PM End time: 02/14/2022 7:37 PM  Staffing Anesthesiologist: Trevor Iha, MD Performed: anesthesiologist   Preanesthetic Checklist Completed: patient identified, IV checked, site marked, risks and benefits discussed, surgical consent, monitors and equipment checked, pre-op evaluation and timeout performed  Epidural Patient position: sitting Prep: DuraPrep and site prepped and draped Patient monitoring: continuous pulse ox and blood pressure Approach: midline Location: L3-L4 Injection technique: LOR air  Needle:  Needle type: Tuohy  Needle gauge: 17 G Needle length: 9 cm and 9 Needle insertion depth: 6 cm Catheter type: closed end flexible Catheter size: 19 Gauge Catheter at skin depth: 12 cm Test dose: negative  Assessment Events: blood not aspirated, injection not painful, no injection resistance, no paresthesia and negative IV test  Additional Notes Patient identified. Risks/Benefits/Options discussed with patient including but not limited to bleeding, infection, nerve damage, paralysis, failed block, incomplete pain control, headache, blood pressure changes, nausea, vomiting, reactions to medication both or allergic, itching and postpartum back pain. Confirmed with bedside nurse the patient's most recent platelet count. Confirmed with patient that they are not currently taking any anticoagulation, have any bleeding history or any family history of bleeding disorders. Patient expressed understanding and wished to proceed. All questions were answered. Sterile technique was used throughout the entire procedure. Please see nursing notes for vital signs. Test dose was given through epidural needle and negative prior to continuing to dose epidural or start infusion. Warning signs of high block given to the patient including shortness of breath, tingling/numbness in hands, complete motor block, or any  concerning symptoms with instructions to call for help. Patient was given instructions on fall risk and not to get out of bed. All questions and concerns addressed with instructions to call with any issues.  1 Attempt (S) . Patient tolerated procedure well.

## 2022-02-15 ENCOUNTER — Encounter (HOSPITAL_COMMUNITY): Payer: Self-pay | Admitting: Family Medicine

## 2022-02-15 DIAGNOSIS — O480 Post-term pregnancy: Secondary | ICD-10-CM

## 2022-02-15 DIAGNOSIS — Z3A41 41 weeks gestation of pregnancy: Secondary | ICD-10-CM

## 2022-02-15 LAB — CBC
HCT: 35.4 % — ABNORMAL LOW (ref 36.0–46.0)
Hemoglobin: 11.8 g/dL — ABNORMAL LOW (ref 12.0–15.0)
MCH: 27.8 pg (ref 26.0–34.0)
MCHC: 33.3 g/dL (ref 30.0–36.0)
MCV: 83.5 fL (ref 80.0–100.0)
Platelets: 242 10*3/uL (ref 150–400)
RBC: 4.24 MIL/uL (ref 3.87–5.11)
RDW: 13.6 % (ref 11.5–15.5)
WBC: 14.8 10*3/uL — ABNORMAL HIGH (ref 4.0–10.5)
nRBC: 0 % (ref 0.0–0.2)

## 2022-02-15 MED ORDER — COCONUT OIL OIL: 1.0000 | TOPICAL_OIL | Status: DC | PRN

## 2022-02-15 MED ORDER — ONDANSETRON HCL 4 MG PO TABS: 4.0000 mg | ORAL_TABLET | ORAL | Status: DC | PRN

## 2022-02-15 MED ORDER — IBUPROFEN 600 MG PO TABS
600.0000 mg | ORAL_TABLET | Freq: Four times a day (QID) | ORAL | Status: DC
  Administered 2022-02-15: 600 mg via ORAL
  Filled 2022-02-15 (×2): qty 1

## 2022-02-15 MED ORDER — WITCH HAZEL-GLYCERIN EX PADS: 1.0000 | MEDICATED_PAD | CUTANEOUS | Status: DC | PRN

## 2022-02-15 MED ORDER — DIBUCAINE (PERIANAL) 1 % EX OINT: 1.0000 | TOPICAL_OINTMENT | CUTANEOUS | Status: DC | PRN

## 2022-02-15 MED ORDER — TETANUS-DIPHTH-ACELL PERTUSSIS 5-2.5-18.5 LF-MCG/0.5 IM SUSY: 0.5000 mL | PREFILLED_SYRINGE | Freq: Once | INTRAMUSCULAR | Status: DC

## 2022-02-15 MED ORDER — BENZOCAINE-MENTHOL 20-0.5 % EX AERO
1.0000 | INHALATION_SPRAY | CUTANEOUS | Status: DC | PRN
  Administered 2022-02-15 – 2022-02-16 (×2): 1 via TOPICAL
  Filled 2022-02-15 (×2): qty 56

## 2022-02-15 MED ORDER — PRENATAL MULTIVITAMIN CH
1.0000 | ORAL_TABLET | Freq: Every day | ORAL | Status: DC
  Administered 2022-02-15 – 2022-02-16 (×2): 1 via ORAL
  Filled 2022-02-15 (×2): qty 1

## 2022-02-15 MED ORDER — SIMETHICONE 80 MG PO CHEW: 80.0000 mg | CHEWABLE_TABLET | ORAL | Status: DC | PRN

## 2022-02-15 MED ORDER — ACETAMINOPHEN 325 MG PO TABS
650.0000 mg | ORAL_TABLET | ORAL | Status: DC | PRN
  Administered 2022-02-15 – 2022-02-17 (×8): 650 mg via ORAL
  Filled 2022-02-15 (×9): qty 2

## 2022-02-15 MED ORDER — ONDANSETRON HCL 4 MG/2ML IJ SOLN: 4.0000 mg | INTRAMUSCULAR | Status: DC | PRN

## 2022-02-15 MED ORDER — SENNOSIDES-DOCUSATE SODIUM 8.6-50 MG PO TABS
2.0000 | ORAL_TABLET | Freq: Every day | ORAL | Status: DC
  Administered 2022-02-16 – 2022-02-17 (×2): 2 via ORAL
  Filled 2022-02-15 (×2): qty 2

## 2022-02-15 MED ORDER — ZOLPIDEM TARTRATE 5 MG PO TABS: 5.0000 mg | ORAL_TABLET | Freq: Every evening | ORAL | Status: DC | PRN

## 2022-02-15 NOTE — Anesthesia Postprocedure Evaluation (Signed)
Anesthesia Post Note  Patient: Development worker, community  Procedure(s) Performed: AN AD HOC LABOR EPIDURAL     Patient location during evaluation: Mother Baby Anesthesia Type: Epidural Level of consciousness: awake and alert Pain management: pain level controlled Vital Signs Assessment: post-procedure vital signs reviewed and stable Respiratory status: spontaneous breathing, nonlabored ventilation and respiratory function stable Cardiovascular status: stable Postop Assessment: no headache, no backache and epidural receding Anesthetic complications: no   No notable events documented.  Last Vitals:  Vitals:   02/15/22 1039 02/15/22 1449  BP: 133/79 (!) 111/59  Pulse: (!) 106 97  Resp:    Temp: 36.8 C   SpO2: 100% 100%    Last Pain:  Vitals:   02/15/22 1449  TempSrc:   PainSc: 0-No pain   Pain Goal: Patients Stated Pain Goal: 0 (02/15/22 0425)                 [facility]

## 2022-02-15 NOTE — Discharge Summary (Signed)
Postpartum Discharge Summary  Date of Service updated***     Patient Name: Gabrielle Garcia DOB: February 25, 2001 MRN: [id number]  Date of admission: 02/13/2022 Delivery date:02/15/2022  Delivering provider: Genia Del  Date of discharge: 02/15/2022  Admitting diagnosis: Post term pregnancy over 40 weeks [O48.0] Intrauterine pregnancy: [redacted]w[redacted]d    Secondary diagnosis:  Principal Problem:   Vaginal delivery Active Problems:   Supervision of low-risk pregnancy   Alpha thalassemia silent carrier   Marginal insertion of umbilical cord affecting management of mother   Post term pregnancy over 40 weeks  Additional problems: gHTN intrapartum     Discharge diagnosis: Term Pregnancy Delivered                                              Post partum procedures: {Postpartum procedures:23558} Augmentation: Pitocin and Cytotec Complications: None  Hospital course: Induction of Labor With Vaginal Delivery   21y.o. yo G2P0010 at 458w0das admitted to the hospital 02/13/2022 for induction of labor.  Indication for induction: NRNST, postdates.  Patient had an uncomplicated labor course and vaginal delivery.  Membrane Rupture Time/Date: 10:00 AM ,02/14/2022   Delivery Method:Vaginal, Spontaneous  Episiotomy: None  Lacerations:  Labial  Details of delivery can be found in separate delivery note.  Patient had a routine postpartum course.  She is eating, drinking, voiding, and ambulating without issue.  Her pain and bleeding are controlled.  She is ***feeding well.  Patient is discharged home 02/15/22.  Newborn Data: Birth date:02/15/2022  Birth time:2:33 AM  Gender:Female  Living status:Living  Apgars:9 ,9  Weight:   Magnesium Sulfate received: No BMZ received: No Rhophylac: N/A MMR: N/A T-DaP: Offered postpartum  Flu: No Transfusion: No  Physical exam  Vitals:   02/14/22 2351 02/15/22 0000 02/15/22 0244 02/15/22 0301  BP: (!) 108/54 110/62 (!) 135/99 122/67  Pulse: 79 80  73 (!) 125  Resp:   18   Temp:      TempSrc:      SpO2:      Weight:      Height:       General: {Exam; general:21111117} Lochia: {Desc; appropriate/inappropriate:30686::"appropriate"} Uterine Fundus: {Desc; firm/soft:30687} Incision: {Exam; incision:21111123} DVT Evaluation: {Exam; dvt:2111122}  Labs: Lab Results  Component Value Date   WBC 10.3 02/13/2022   HGB 12.8 02/13/2022   HCT 38.9 02/13/2022   MCV 84.6 02/13/2022   PLT 259 02/13/2022      Latest Ref Rng & Units 07/25/2021    5:30 PM  CMP  Glucose 70 - 99 mg/dL 82   BUN 6 - 20 mg/dL 16   Creatinine 0.44 - 1.00 mg/dL 0.54   Sodium 135 - 145 mmol/L 132   Potassium 3.5 - 5.1 mmol/L 3.2   Chloride 98 - 111 mmol/L 105   CO2 22 - 32 mmol/L 22   Calcium 8.9 - 10.3 mg/dL 8.8   Total Protein 6.5 - 8.1 g/dL 7.4   Total Bilirubin 0.3 - 1.2 mg/dL 0.3   Alkaline Phos 38 - 126 U/L 28   AST 15 - 41 U/L 13   ALT 0 - 44 U/L 9    Edinburgh Score:     No data to display          After visit meds:  Allergies as of 02/15/2022       Reactions  Benadryl [diphenhydramine] Itching, Swelling     Med Rec must be completed prior to using this [facility]***       Discharge home in stable condition Infant Feeding: {Baby feeding:23562} Infant Disposition:{CHL IP OB HOME WITH DCVUDT:14388} Discharge instruction: per After Visit Summary and Postpartum booklet. Activity: Advance as tolerated. Pelvic rest for 6 weeks.  Diet: routine diet Future Appointments:No future appointments. Follow up Visit: Message sent to [facility] by Dr. Gwenlyn Perking on 02/15/22.   Please schedule this patient for a In person postpartum visit in 6 weeks with the following provider: Any provider. Additional Postpartum F/U: BP check 1 week  Low risk pregnancy complicated by: gHTN intrapartum  Delivery mode:  Vaginal, Spontaneous  Anticipated Birth Control:  Depo  02/15/2022 Genia Del, MD

## 2022-02-15 NOTE — Lactation Note (Signed)
This note was copied from a baby's chart. Lactation Consultation Note  Patient Name: Gabrielle Garcia TDHRC'[id number] Date: 02/15/2022 Reason for consult: Initial assessment;Primapara;1st time breastfeeding;Term Age:21 hours  Visited with mom of 13 hours old FT female, she's a P1 and reported (+) breast changes during the pregnancy. LC assisted with diaper change, burping, hand expression and latching; baby was awake and alert but not ready to latch, he was still spitting up and had a large spit of amminitoc fluid prior this attempt; only a few sucks before he fell back to sleep. Reviewed normal newborn behavior, feeding cues, anticipatory guidelines, cluster feeding, size of baby's stomach and pumping schedule, mom brought her DEBP from home and plans pumping while at the hospital.  Maternal Data Has patient been taught Hand Expression?: Yes Does the patient have breastfeeding experience prior to this delivery?: No  Feeding Mother's Current Feeding Choice: Breast Milk  Interventions Interventions: Education;LC Services brochure;Breast feeding basics reviewed;Assisted with latch;Skin to skin;Breast massage;Hand express;Breast compression;Adjust position;Support pillows  Plan of care Encouraged mom to put baby to breast 8-12 times/24 hours or sooner if feeding cues are present Hand expression and spoon feeding were also encouraged She'll start pumping at the hospital after feedings/attempts at the breast per personal preference  FOB present and supportive. All questions and concerns answered, parents to contact LC services PRN.  Discharge Pump: Personal (DEBP at home (she brought it to the hospital))  Consult Status Consult Status: Follow-up Date: 02/15/22 Follow-up type: In-patient   Alfreda Hammad Venetia Constable 02/15/2022, 4:22 PM

## 2022-02-15 NOTE — Progress Notes (Signed)
Labor Progress Note Gabrielle Garcia is a 21 y.o. G2P0010 at [redacted]w[redacted]d who presented for IOL due to NRNST at term.   S: Doing well. Feeling intermittent pressure. No concerns.   O:  BP 110/62   Pulse 80   Temp 98.4 F (36.9 C) (Oral)   Resp 17   Ht 5\' 4"  (1.626 m)   Wt 83 kg   LMP 05/04/2021   SpO2 100%   BMI 31.41 kg/m   EFM: Baseline 145 bpm, moderate variability, + accels, late decels  Toco: Every 2-3 minutes   CVE: Dilation: 10 Dilation Complete Date: 02/15/22 Dilation Complete Time: 0018 Effacement (%): 100 Cervical Position: Posterior Station: -1 Presentation: Vertex Exam by:: [email or web address], RN  A&P: 21 y.o. G2P0010 [redacted]w[redacted]d   #Labor: Progressing well. Now complete, but still -1 station per RN. Will allow patient to labor down for the next hour as tolerated and reassess after that time.  #Pain: Epidural  #FWB: Cat 2 due to late decelerations. Pitocin discontinued. FHT now improving. Continues to have reassuring variability and accels. Will continue to monitor closely.  #GBS negative  [redacted]w[redacted]d, MD 12:55 AM

## 2022-02-15 NOTE — Lactation Note (Signed)
This note was copied from a baby's chart. Lactation Consultation Note  Patient Name: Gabrielle Garcia EPPIR'J Date: 02/15/2022 Age:21 hours   Mom and baby are sleeping upon visit. LC will come back to room at another time as possible.    Ivonna Kinnick A Higuera Ancidey 02/15/2022, 2:19 PM

## 2022-02-15 NOTE — Progress Notes (Signed)
Patient alerted me that she is allergic to ibuprofen, not previously listed in her 'allergies/contraindications". I was not aware of that when she took it earlier at 10:41am. She said she did not realize that is what I had given her, but reported that she did break out in hives to which she "just applied cream". She says it makes her "break out in hives and my throat and face swell". I added it to her allergy list and called the MD to have the ibuprofen order discontinued.

## 2022-02-16 MED ORDER — NIFEDIPINE ER OSMOTIC RELEASE 30 MG PO TB24
30.0000 mg | ORAL_TABLET | Freq: Every day | ORAL | Status: DC
  Administered 2022-02-16 – 2022-02-17 (×2): 30 mg via ORAL
  Filled 2022-02-16 (×2): qty 1

## 2022-02-16 MED ORDER — OXYCODONE HCL 5 MG PO TABS
5.0000 mg | ORAL_TABLET | ORAL | Status: DC | PRN
  Administered 2022-02-16 (×3): 5 mg via ORAL
  Filled 2022-02-16 (×3): qty 1

## 2022-02-16 MED ORDER — FUROSEMIDE 20 MG PO TABS
20.0000 mg | ORAL_TABLET | Freq: Every day | ORAL | Status: DC
  Administered 2022-02-16 – 2022-02-17 (×2): 20 mg via ORAL
  Filled 2022-02-16 (×2): qty 1

## 2022-02-16 NOTE — Lactation Note (Signed)
This note was copied from a baby's chart. Lactation Consultation Note  Patient Name: Gabrielle Garcia [id number] Date: 02/16/2022 Reason for consult: Mother's request;Follow-up assessment Age:21 hours Mom feels infant is latching well at the breast.  Per mom, she recently pumped 20 mls but she spilt her EBM on the floor, infant BF at 2150 for 13 minutes, due to spilling her EBM she supplemented infant with 20 mls of formula. Mom wanted LC assess infant's latch, mom latched infant on her left breast using the cradle hold, infant latched with depth, wide mouth and infant was transferring EBM with latch,  infant BF for 8 minutes and infant was given 8 mls of mom's EBM using a White Nfant nipple. . Mom's current plan: 1- Mom will continue to breastfeed infant according to hunger cues, 8 to 12+ times within 24 hours, skin to skin. 2- Afterwards mom will supplement infant with her EBM that is pumped first before offering formula. 3- Mom knows on day 3 to supplement infant with 18-25 mls of EBM/Formula after latching infant at the breast. 4- Mom plans to continue to use DEBP, pump every 3 hours for 15 minutes on initial setting.  Maternal Data    Feeding Mother's Current Feeding Choice: Breast Milk and Formula  LATCH Score Latch: Grasps breast easily, tongue down, lips flanged, rhythmical sucking.  Audible Swallowing: A few with stimulation  Type of Nipple: Everted at rest and after stimulation  Comfort (Breast/Nipple): Soft / non-tender  Hold (Positioning): Assistance needed to correctly position infant at breast and maintain latch.  LATCH Score: 8   Lactation Tools Discussed/Used    Interventions Interventions: Assisted with latch;Skin to skin;Breast compression;Adjust position;Support pillows;Position options;Expressed milk;Education;Pace feeding  Discharge    Consult Status Consult Status: Follow-up Date: 02/17/22 Follow-up type: In-patient    Danelle Earthly 02/16/2022, 11:17 PM

## 2022-02-16 NOTE — Progress Notes (Signed)
POSTPARTUM PROGRESS NOTE  Post Partum Day 1  Subjective:  Gabrielle Garcia is a 21 y.o. G2P1011 s/p VD at [redacted]w[redacted]d.  She reports she is doing well. No acute events overnight. She denies any problems with ambulating, voiding or po intake. Denies nausea or vomiting.  Pain is well controlled.  Lochia is mild. She would like to stay through tomorrow.   Objective: Blood pressure 139/85, pulse 93, temperature 98.3 F (36.8 C), temperature source Oral, resp. rate 20, height 5\' 4"  (1.626 m), weight 83 kg, last menstrual period 05/04/2021, SpO2 100 %, unknown if currently breastfeeding.  Physical Exam:  General: alert, cooperative and no distress Chest: no respiratory distress Heart:regular rate, distal pulses intact Uterine Fundus: firm, appropriately tender DVT Evaluation: No calf swelling or tenderness Extremities: minimal edema Skin: warm, dry  Recent Labs    02/15/22 0535  HGB 11.8*  HCT 35.4*    Assessment/Plan: Gabrielle Garcia is a 22 y.o. G2P1011 s/p VD at [redacted]w[redacted]d   PPD#1 - Doing well  Routine postpartum care  #Elevated BP postpartum: Start procardia and lasix. Plan for baby scripts postpartum.   Consented mom for neonatal circ.   Contraception:  Feeding: breastfeeding  Dispo: Plan for discharge tomorrow.   LOS: 3 days   [redacted]w[redacted]d, DO  OB Fellow  02/16/2022, 4:43 PM

## 2022-02-16 NOTE — Progress Notes (Signed)
CSW met with MOB at bedside to address consult placed for food insecurity. When CSW entered room, FOB was present. MOB provided verbal consent to speak in front of FOB about anything. CSW introduced self and reason for consult. MOB was polite and easy to engage.  MOB reports she is not in need of additional food resources at this time and declined food bank resources. MOB reports she receives [facility]. CSW encouraged MOB to contact [facility] office on Monday to add infant to benefits. MOB reports she has applied for food stamps but is not eligible due to residing with her her mother and states her mother receives benefits. CSW encouraged MOB to reapply for food stamps now that she has given birth.   CSW inquired about additional resource needs. MOB reports she is connected with a home visiting program through [facility] and receives diapers, wipes, and baby clothes through [employer]. MOB also reports she is connected with another home visiting program but was unable to recall the name. MOB reports she feels supported at this time and has all needed baby items including a car seat and crib. MOB declined additional resource needs at this time.   Signed,  Berniece Salines, MSW, [place], [place] 02/16/2022 2:05 PM

## 2022-02-17 ENCOUNTER — Other Ambulatory Visit (HOSPITAL_COMMUNITY): Payer: Self-pay

## 2022-02-17 LAB — TYPE AND SCREEN
ABO/RH(D): A POS
Antibody Screen: POSITIVE
Unit division: 0
Unit division: 0

## 2022-02-17 LAB — BPAM RBC
Blood Product Expiration Date: 202307292359
Blood Product Expiration Date: 202307302359
Unit Type and Rh: 6200
Unit Type and Rh: 6200

## 2022-02-17 MED ORDER — EPINEPHRINE TOPICAL FOR CIRCUMCISION 0.1 MG/ML: 1.0000 [drp] | TOPICAL | Status: DC | PRN

## 2022-02-17 MED ORDER — LIDOCAINE 1% INJECTION FOR CIRCUMCISION: 0.8000 mL | INJECTION | Freq: Once | INTRAVENOUS | Status: DC

## 2022-02-17 MED ORDER — FUROSEMIDE 20 MG PO TABS
20.0000 mg | ORAL_TABLET | Freq: Every day | ORAL | 0 refills | Status: DC
  Filled 2022-02-17: qty 3, 3d supply, fill #0

## 2022-02-17 MED ORDER — ACETAMINOPHEN 500 MG PO TABS
1000.0000 mg | ORAL_TABLET | Freq: Three times a day (TID) | ORAL | 0 refills | Status: DC | PRN
  Filled 2022-02-17: qty 60, 10d supply, fill #0

## 2022-02-17 MED ORDER — NIFEDIPINE ER 30 MG PO TB24
30.0000 mg | ORAL_TABLET | Freq: Every day | ORAL | 0 refills | Status: DC
  Filled 2022-02-17: qty 60, 60d supply, fill #0

## 2022-02-17 MED ORDER — SUCROSE 24% NICU/PEDS ORAL SOLUTION: 0.5000 mL | OROMUCOSAL | Status: DC | PRN

## 2022-02-17 MED ORDER — WHITE PETROLATUM EX OINT: 1.0000 | TOPICAL_OINTMENT | CUTANEOUS | Status: DC | PRN

## 2022-02-17 NOTE — Lactation Note (Signed)
This note was copied from a baby's chart. Lactation Consultation Note  Patient Name: Gabrielle Garcia ESPQZ'R Date: 02/17/2022 Reason for consult: Follow-up assessment;Mother's request;Term;Infant weight loss;Breastfeeding assistance;Other (Comment) (PIH ( Nifedipine)) Age:21  Infant weight loss today 30 grams. Infant adequate urine and stool  output with transition of stool color.  Mom milk coming in getting 20 ml with last pumping session up from 10.  Mom aware if gets 20 ml ( 3x) row switch to maintenance setting and set timer for 20 mins.  Mom denied any pain with the latch or pumping.   For this feeding, Mom did not latch infant and dad fed 20 mls. LC used Nfant white nipple to see if he would take more. Milk leaking from sides in different positions including sideline and pace bottle feeding. Infant not able to grasp finger with suck, has sensitive gag reflex. RN, Candace Cruise noticed same with his latch. Speech was contacted for a consult.   Plan 1. To feed based on cues 8-12x 24hr period. Mom to offer breasts and look for signs of milk transfer.  2. Mom to supplement with EBM first followed by formula with Nfant white nipple and pace bottle feeding. (18-25 ml per feeding if he does not latch can offer more.) 3. Post pump after each feeding and settings discussed in flow sheet ( with mother)   All questions answered at the end of the visit.   Maternal Data    Feeding Mother's Current Feeding Choice: Breast Milk and Formula Nipple Type: Nfant Standard Flow (white)  LATCH Score                    Lactation Tools Discussed/Used Tools: Pump;Flanges Breast pump type: Double-Electric Breast Pump Pump Education: Setup, frequency, and cleaning;Milk Storage Reason for Pumping: increase stimulation Pumping frequency: post pump after each feeding for 15 mins. Mom aware to change to maintenance setting if get 20 ml 3 x in  row.  Interventions Interventions: Breast feeding basics reviewed;Hand express;Expressed milk;DEBP;Pace feeding;Infant Driven Feeding Algorithm education  Discharge Pump: DEBP;Personal ([place]) WIC Program: No  Consult Status Consult Status: Follow-up Date: 02/18/22 Follow-up type: In-patient    Travone Georg  Nicholson-Springer 02/17/2022, 2:37 PM

## 2022-02-18 ENCOUNTER — Ambulatory Visit: Payer: Self-pay

## 2022-02-18 NOTE — Lactation Note (Signed)
This note was copied from a baby's chart. Lactation Consultation Note  Patient Name: Boy Addilyne Backs [id number]'U Date: 02/18/2022 Reason for consult: Follow-up assessment;Primapara;Term Age:21 hours  Mom says infant is doing well at the breast and she can tell that he is "drinking" from the breast. She follows most feedings at the breast with a bottle of EBM. Infant has gained 89 g since yesterday afternoon. When bottle feeding, Mom knows to feed infant until content, whether she has offered the breast or not. Mom has noted that if infant feeds well at the breast then he may not take as much from a bottle.   Mom's questions about pumping answered. She has a Lansinoh pump at home and, based on visual inspection of L breast, its size 25 flange will be a good fit for her (she has been comfortable with the size 24 flange while here).   Mom knows how to reach [place] for post-d/c questions and is now aware of [facility]. I provided Mom with an additional Nfant Extra-Slow flow nipple for home use.   Maternal Data Does the patient have breastfeeding experience prior to this delivery?: No  Lactation Tools Discussed/Used Tools: Pump Flange Size: 24 Breast pump type: Double-Electric Breast Pump Pumping frequency: q3h Pumped volume: 60 mL  Interventions Interventions: Education  Discharge Pump: Personal (Mom has a Lansinoh pump at home)  Consult Status Consult Status: Complete    Remigio Eisenmenger 02/18/2022, 10:11 AM

## 2022-02-19 ENCOUNTER — Inpatient Hospital Stay (HOSPITAL_COMMUNITY): Payer: Medicaid Other

## 2022-02-19 ENCOUNTER — Inpatient Hospital Stay (HOSPITAL_COMMUNITY): Admission: AD | Admit: 2022-02-19 | Payer: Medicaid Other | Source: Home / Self Care | Admitting: Family Medicine

## 2022-02-24 ENCOUNTER — Other Ambulatory Visit: Payer: Self-pay

## 2022-02-24 ENCOUNTER — Ambulatory Visit (INDEPENDENT_AMBULATORY_CARE_PROVIDER_SITE_OTHER): Payer: Medicaid Other | Admitting: *Deleted

## 2022-02-24 DIAGNOSIS — Z309 Encounter for contraceptive management, unspecified: Secondary | ICD-10-CM

## 2022-02-24 NOTE — Progress Notes (Signed)
Pt presents for BP check following vaginal delivery on 02/15/22.  BP - 128/89, P - 90.  She denies H/A or visual disturbances. She endorses taking Nifedipine 30 mg daily. Pt will follow up for post partum visit on 8/30 as scheduled. She stated her preference to have IUD for birth control instead of Depo Provera. Pt advised to refrain from sex until 8/30 and the IUD will be inserted @ PP appt.  She voiced understanding.

## 2022-03-11 ENCOUNTER — Other Ambulatory Visit (HOSPITAL_COMMUNITY): Payer: Self-pay

## 2022-03-27 ENCOUNTER — Ambulatory Visit: Payer: Medicaid Other | Admitting: Family Medicine

## 2022-04-02 ENCOUNTER — Other Ambulatory Visit: Payer: Self-pay

## 2022-04-02 ENCOUNTER — Ambulatory Visit (INDEPENDENT_AMBULATORY_CARE_PROVIDER_SITE_OTHER): Payer: Medicaid Other | Admitting: Family Medicine

## 2022-04-02 ENCOUNTER — Other Ambulatory Visit (HOSPITAL_COMMUNITY)
Admission: RE | Admit: 2022-04-02 | Discharge: 2022-04-02 | Disposition: A | Discharge status: Home / Self Care | Payer: Medicaid Other | Source: Ambulatory Visit | Attending: Family Medicine | Admitting: Family Medicine

## 2022-04-02 ENCOUNTER — Encounter: Payer: Self-pay | Admitting: Family Medicine

## 2022-04-02 DIAGNOSIS — Z30430 Encounter for insertion of intrauterine contraceptive device: Secondary | ICD-10-CM

## 2022-04-02 DIAGNOSIS — Z3202 Encounter for pregnancy test, result negative: Secondary | ICD-10-CM | POA: Diagnosis not present

## 2022-04-02 DIAGNOSIS — B9689 Other specified bacterial agents as the cause of diseases classified elsewhere: Secondary | ICD-10-CM

## 2022-04-02 DIAGNOSIS — Z113 Encounter for screening for infections with a predominantly sexual mode of transmission: Secondary | ICD-10-CM

## 2022-04-02 DIAGNOSIS — N760 Acute vaginitis: Secondary | ICD-10-CM

## 2022-04-02 LAB — POCT PREGNANCY, URINE: Preg Test, Ur: NEGATIVE

## 2022-04-02 MED ORDER — LEVONORGESTREL 20.1 MCG/DAY IU IUD
1.0000 | INTRAUTERINE_SYSTEM | Freq: Once | INTRAUTERINE | Status: AC
  Administered 2022-04-02: 1 via INTRAUTERINE

## 2022-04-02 NOTE — Patient Instructions (Signed)
   We recommended establishing primary care so you have a doctor after delivery of your baby to help make sure you remain healthy and have a place to be seen for any chronic conditions or if you get sick. [facility] has a way for patients to sign up online for a primary care visit.   There are options for Family Medicine- doctors that can see your whole family Internal Medicine- doctors that can see only people > 21 years old   Here is the website: [email or web address]  

## 2022-04-02 NOTE — Progress Notes (Signed)
Post Partum Visit Note  Gabrielle Garcia is a 21 y.o. G68P1011 female who presents for a postpartum visit. She is 6 weeks postpartum following a normal spontaneous vaginal delivery.  I have fully reviewed the prenatal and intrapartum course. The delivery was at 102w0d gestational weeks.  Anesthesia: epidural. Postpartum course has been difficult but transitioning well.Gabrielle Garcia is doing well. Baby is feeding by bottle - Similac Advance. Bleeding staining only. Bowel function is normal. Bladder function is normal. Patient is not sexually active. Contraception method is IUD. Postpartum depression screening: negative.   The pregnancy intention screening data noted above was reviewed. Potential methods of contraception were discussed. The patient elected to proceed with No data recorded.   Edinburgh Postnatal Depression Scale - 04/02/22 1653       Edinburgh Postnatal Depression Scale:  In the Past 7 Days   I have been able to laugh and see the funny side of things. 0    I have looked forward with enjoyment to things. 0    I have blamed myself unnecessarily when things went wrong. 0    I have been anxious or worried for no good reason. 0    I have felt scared or panicky for no good reason. 0    Things have been getting on top of me. 0    I have been so unhappy that I have had difficulty sleeping. 0    I have felt sad or miserable. 0    I have been so unhappy that I have been crying. 0    The thought of harming myself has occurred to me. 0    Edinburgh Postnatal Depression Scale Total 0             Health Maintenance Due  Topic Date Due   COVID-19 Vaccine (1) Never done   PAP-Cervical Cytology Screening  Never done   PAP SMEAR-Modifier  Never done   INFLUENZA VACCINE  03/04/2022    The following portions of the patient's history were reviewed and updated as appropriate: allergies, current medications, past family history, past medical history, past social history, past surgical  history, and problem list.  Review of Systems Pertinent items are noted in HPI.  Objective:  BP (!) 140/92   Pulse (!) 101   Wt 161 lb 14.4 oz (73.4 kg)   LMP 05/04/2021   Breastfeeding No   BMI 27.79 kg/m    General:  alert, cooperative, and appears stated age   Breasts:  not indicated  Lungs: clear to auscultation bilaterally  Heart:  regular rate and rhythm, S1, S2 normal, no murmur, click, rub or gallop  Abdomen: soft, non-tender; bowel sounds normal; no masses,  no organomegaly   Wound NA  GU exam:  normal       Assessment:   1. Screening examination for STD (sexually transmitted disease) - Cervicovaginal ancillary only( [place])  2. Encounter for IUD insertion - levonorgestrel (LILETTA) 20.1 MCG/DAY IUD 1 each  3. Postpartum exam - metroNIDAZOLE (FLAGYL) 500 MG tablet; Take 1 tablet (500 mg total) by mouth 2 (two) times daily.  Dispense: 14 tablet; Refill: 0  4. BV (bacterial vaginosis) - metroNIDAZOLE (FLAGYL) 500 MG tablet; Take 1 tablet (500 mg total) by mouth 2 (two) times daily.  Dispense: 14 tablet; Refill: 0  Normal postpartum exam.   Plan:   Essential components of care per ACOG recommendations:  1.  Mood and well being: Patient with negative depression screening today. Reviewed local  resources for support.  - Patient tobacco use? No.   - hx of drug use? No.    2. Infant care and feeding:  -Patient currently breastmilk feeding? No.  -Social determinants of health (SDOH) reviewed in EPIC. No concerns 3. Sexuality, contraception and birth spacing - Patient does not want a pregnancy in the next year.  Desired family size is 2 children.  - Reviewed reproductive life planning. Reviewed contraceptive methods based on pt preferences and effectiveness.  Patient desired IUD or IUS today.   - Discussed birth spacing of 18 months  4. Sleep and fatigue -Encouraged family/partner/community support of 4 hrs of uninterrupted sleep to help with mood and  fatigue  5. Physical Recovery  - Discussed patients delivery and complications. She describes her labor as good. - Patient had a Vaginal, no problems at delivery. Patient had no laceration. Perineal healing reviewed. Patient expressed understanding - Patient has urinary incontinence? No. - Patient is safe to resume physical and sexual activity  6.  Health Maintenance - HM due items addressed Yes - Last pap smear -- realized after patient left that she turned 21 at the end of pregnancy. She needs a pap smear, message written.  -Breast Cancer screening indicated? No.   7. Chronic Disease/Pregnancy Condition follow up: None  #Asthma/eczema-- PCP follow up  Return in about 2 weeks (around 04/16/2022) for BP check with RN.  Future Appointments  Date Time Provider Department Center  04/16/2022  3:00 PM [facility] NURSE [facility]     [name], MD Center for [facility], [facility]

## 2022-04-04 ENCOUNTER — Encounter: Payer: Self-pay | Admitting: Family Medicine

## 2022-04-04 LAB — CERVICOVAGINAL ANCILLARY ONLY
Bacterial Vaginitis (gardnerella): POSITIVE — AB
Candida Glabrata: NEGATIVE
Candida Vaginitis: NEGATIVE
Chlamydia: NEGATIVE
Comment: NEGATIVE
Comment: NEGATIVE
Comment: NEGATIVE
Comment: NEGATIVE
Comment: NEGATIVE
Comment: NORMAL
Neisseria Gonorrhea: NEGATIVE
Trichomonas: NEGATIVE

## 2022-04-04 MED ORDER — METRONIDAZOLE 500 MG PO TABS: 500.0000 mg | ORAL_TABLET | Freq: Two times a day (BID) | ORAL | 0 refills | Status: DC

## 2022-04-08 ENCOUNTER — Telehealth: Payer: Self-pay

## 2022-04-08 NOTE — Telephone Encounter (Signed)
[name], MD  P [facility] Clinical Pool BV present. Sent in meds Friday. Please check in about receiving message and getting medications.    Called pt; VM left stating I am calling to check in regarding results and needed medications. Call back number given to return call to discuss.

## 2022-04-12 ENCOUNTER — Encounter: Payer: Self-pay | Admitting: Family Medicine

## 2022-04-16 ENCOUNTER — Ambulatory Visit (INDEPENDENT_AMBULATORY_CARE_PROVIDER_SITE_OTHER): Payer: Medicaid Other

## 2022-04-16 ENCOUNTER — Other Ambulatory Visit: Payer: Self-pay

## 2022-04-16 VITALS — BP 115/87 | HR 83 | Wt 163.0 lb

## 2022-04-16 DIAGNOSIS — Z0130 Encounter for examination of blood pressure without abnormal findings: Secondary | ICD-10-CM

## 2022-04-16 NOTE — Progress Notes (Signed)
Blood Pressure Check Visit  Gabrielle Garcia is here for blood pressure check following postpartum visit on 04/02/22. History of vaginal delivery on 02/15/22.  Newton MD recommended that patient restart BP med after BP was elevated at postpartum visit. Pt reports taking Nifedipine 30 mg daily. BP today is 115/87. Denies s/s of hypertension currently, but describes she has experienced headache intermittently over the past week. Pt will continue Nifedipine and follow up at IUD string check appt.   Pt reports she has been bleeding for past 8 days. States she is covering 2/3 of a pad each hour and sometimes passing clots. Pt is unsure if this is menstrual period or related to IUD insertion. Reviewed with Crissie Reese MD who states this is abnormal response to IUD insertion and patient may need to be evaluated. Pt states she would like to monitor at home for a few days as bleeding seems to be lighter today. Will contact office if bleeding does not subside within a few days.   Assisted patient with scheduling PCP appt for continued BP management; will be seen by [facility]. Pt expresses concern that carpal tunnel symptoms have continued outside of pregnancy in right wrist/hand. Pt will trial wrist brace and follow up at PCP appt.  Marjo Bicker, RN 04/16/2022  3:09 PM

## 2022-04-28 ENCOUNTER — Telehealth: Payer: Self-pay | Admitting: Family Medicine

## 2022-04-28 NOTE — Telephone Encounter (Signed)
Patient called in saying she has been bleeding non-stop and having bad cramping since she got her iud out in, she wants to know if she can get anything to help her until her appointment on 10/9.

## 2022-04-29 NOTE — Telephone Encounter (Signed)
Called patient, no answer- left message to call us back or send a mychart message if she still has questions.

## 2022-05-12 ENCOUNTER — Other Ambulatory Visit: Payer: Self-pay

## 2022-05-12 ENCOUNTER — Encounter: Payer: Self-pay | Admitting: Family Medicine

## 2022-05-12 ENCOUNTER — Ambulatory Visit (INDEPENDENT_AMBULATORY_CARE_PROVIDER_SITE_OTHER): Payer: Medicaid Other | Admitting: Family Medicine

## 2022-05-12 VITALS — BP 126/89 | HR 81 | Ht 64.0 in | Wt 162.6 lb

## 2022-05-12 DIAGNOSIS — Z30431 Encounter for routine checking of intrauterine contraceptive device: Secondary | ICD-10-CM

## 2022-05-12 LAB — POCT PREGNANCY, URINE: Preg Test, Ur: NEGATIVE

## 2022-05-12 NOTE — Progress Notes (Signed)
   Subjective:    Patient ID: Gabrielle Garcia is a 21 y.o. female presenting with Follow-up and Contraception  on 05/12/2022  HPI: Liletta placed on 08/30. Has been having some bleeding and now it is just spotting. Worried it has come out or that she might be pregnant again. She is not  Review of Systems  Constitutional:  Negative for chills and fever.  Respiratory:  Negative for shortness of breath.   Cardiovascular:  Negative for chest pain.  Gastrointestinal:  Negative for abdominal pain, nausea and vomiting.  Genitourinary:  Negative for dysuria.  Skin:  Negative for rash.      Objective:    BP 126/89   Pulse 81   Ht 5\' 4"  (1.626 m)   Wt 162 lb 9.6 oz (73.8 kg)   LMP  (LMP Unknown)   Breastfeeding No   BMI 27.91 kg/m  Physical Exam Exam conducted with a chaperone present.  Constitutional:      General: She is not in acute distress.    Appearance: She is well-developed.  HENT:     Head: Normocephalic and atraumatic.  Eyes:     General: No scleral icterus. Cardiovascular:     Rate and Rhythm: Normal rate.  Pulmonary:     Effort: Pulmonary effort is normal.  Abdominal:     Palpations: Abdomen is soft.  Musculoskeletal:     Cervical back: Neck supple.  Skin:    General: Skin is warm and dry.  Neurological:     Mental Status: She is alert and oriented to person, place, and time.         Assessment & Plan:  IUD check up - usual bleeding profile discussed--IUD strings noted.   Return if symptoms worsen or fail to improve.  Donnamae Jude, MD 05/12/2022 4:18 PM

## 2022-11-06 ENCOUNTER — Ambulatory Visit (HOSPITAL_COMMUNITY)
Admission: EM | Admit: 2022-11-06 | Discharge: 2022-11-06 | Disposition: A | Discharge status: Home / Self Care | Payer: Medicaid Other | Attending: Family Medicine | Admitting: Family Medicine

## 2022-11-06 ENCOUNTER — Encounter (HOSPITAL_COMMUNITY): Payer: Self-pay

## 2022-11-06 DIAGNOSIS — K529 Noninfective gastroenteritis and colitis, unspecified: Secondary | ICD-10-CM | POA: Diagnosis not present

## 2022-11-06 MED ORDER — DICYCLOMINE HCL 20 MG PO TABS: 20.0000 mg | ORAL_TABLET | Freq: Four times a day (QID) | ORAL | 0 refills | Status: DC | PRN

## 2022-11-06 MED ORDER — ONDANSETRON 4 MG PO TBDP: 4.0000 mg | ORAL_TABLET | Freq: Three times a day (TID) | ORAL | 0 refills | Status: DC | PRN

## 2022-11-06 NOTE — ED Triage Notes (Signed)
Pt c/o vomiting/diarrhea x5 since 6 am. States able to keep fluids now.

## 2022-11-06 NOTE — Discharge Instructions (Addendum)
Ondansetron dissolved in the mouth every 8 hours as needed for nausea or vomiting. Clear liquids and bland things to eat. Avoid acidic foods like lemon/lime/orange/tomato.  Dicyclomine--take 1 every 6 hours as needed for intestinal cramps   

## 2022-11-06 NOTE — ED Provider Notes (Signed)
[facility]    CSN: [id number] Arrival date & time: 11/06/22  1748      History   Chief Complaint Chief Complaint  Patient presents with   Emesis   Diarrhea    HPI Gabrielle Garcia is a 22 y.o. female.    Emesis Associated symptoms: diarrhea   Diarrhea Associated symptoms: vomiting    Here for nausea and vomiting and diarrhea.  Symptoms began this morning at 6.  She is thrown up about 5 or 6 times and has had about that many loose stools.  It has been 2 or 3 hours since she last had  any emesis.  No fever or chills.  She has had a recent URI, but those symptoms are improving.  Last menstrual cycle was April 3.  Past Medical History:  Diagnosis Date   Asthma    Eczema    Hx of chlamydia infection 2020   Hx of trichomoniasis     Patient Active Problem List   Diagnosis Date Noted   Alpha thalassemia silent carrier 08/15/2021   Asthma    Atopic dermatitis 12/20/2012    Past Surgical History:  Procedure Laterality Date   WISDOM TOOTH EXTRACTION      OB History     Gravida  2   Para  1   Term  1   Preterm  0   AB  1   Living  1      SAB  1   IAB  0   Ectopic  0   Multiple  0   Live Births  1            Home Medications    Prior to Admission medications   Medication Sig Start Date End Date Taking? Authorizing Provider  dicyclomine (BENTYL) 20 MG tablet Take 1 tablet (20 mg total) by mouth 4 (four) times daily as needed (intestinal cramps). 11/06/22  Yes Barrett Henle, MD  albuterol (VENTOLIN HFA) 108 (90 Base) MCG/ACT inhaler Inhale 2 puffs into the lungs every 4 (four) hours as needed for wheezing or shortness of breath (or cough). Patient not taking: Reported on 04/16/2022 05/22/21   Ok Edwards, MD  NIFEdipine (ADALAT CC) 30 MG 24 hr tablet Take 1 tablet (30 mg total) by mouth daily. 02/17/22   Genia Del, MD  ondansetron (ZOFRAN-ODT) 4 MG disintegrating tablet Take 1 tablet (4 mg total) by mouth  every 8 (eight) hours as needed for nausea or vomiting. 11/06/22   Barrett Henle, MD  norgestimate-ethinyl estradiol (ORTHO-CYCLEN,SPRINTEC,PREVIFEM) 0.25-35 MG-MCG tablet Take 1 tablet by mouth daily. Patient not taking: Reported on 07/19/2018 02/08/18 01/14/20  Tresea Mall, CNM    Family History Family History  Problem Relation Age of Onset   28 / Stillbirths Mother    Hypertension Mother    Eczema Mother    Urticaria Mother    Hypertension Father    Asthma Brother    Hypertension Other    Diabetes Other    Allergic rhinitis Neg Hx    Angioedema Neg Hx    Atopy Neg Hx    Immunodeficiency Neg Hx     Social History Social History   Tobacco Use   Smoking status: Former    Types: Cigars   Smokeless tobacco: Never  Vaping Use   Vaping Use: Never used  Substance Use Topics   Alcohol use: No   Drug use: No     Allergies   Benadryl [diphenhydramine] and  Ibuprofen   Review of Systems Review of Systems  Gastrointestinal:  Positive for diarrhea and vomiting.     Physical Exam Triage Vital Signs ED Triage Vitals  Enc Vitals Group     BP 11/06/22 1831 114/80     Pulse Rate 11/06/22 1831 (!) 117     Resp 11/06/22 1831 18     Temp 11/06/22 1831 98.2 F (36.8 C)     Temp Source 11/06/22 1831 Oral     SpO2 11/06/22 1831 97 %     Weight --      Height --      Head Circumference --      Peak Flow --      Pain Score 11/06/22 1832 7     Pain Loc --      Pain Edu? --      Excl. in [place]? --    No data found.  Updated Vital Signs BP 114/80 (BP Location: Left Arm)   Pulse (!) 117   Temp 98.2 F (36.8 C) (Oral)   Resp 18   LMP 11/05/2022   SpO2 97%   Breastfeeding No   Visual Acuity Right Eye Distance:   Left Eye Distance:   Bilateral Distance:    Right Eye Near:   Left Eye Near:    Bilateral Near:     Physical Exam Vitals reviewed.  Constitutional:      General: She is not in acute distress.    Appearance: She is not ill-appearing,  toxic-appearing or diaphoretic.  HENT:     Nose: Nose normal.     Mouth/Throat:     Mouth: Mucous membranes are moist.     Pharynx: No oropharyngeal exudate or posterior oropharyngeal erythema.  Eyes:     Extraocular Movements: Extraocular movements intact.     Conjunctiva/sclera: Conjunctivae normal.     Pupils: Pupils are equal, round, and reactive to light.  Cardiovascular:     Rate and Rhythm: Regular rhythm. Tachycardia present.     Heart sounds: No murmur heard. Pulmonary:     Effort: Pulmonary effort is normal.     Breath sounds: Normal breath sounds.  Abdominal:     General: There is no distension.     Palpations: Abdomen is soft.     Tenderness: There is abdominal tenderness (generalized). There is no guarding.  Musculoskeletal:     Cervical back: Neck supple.  Lymphadenopathy:     Cervical: No cervical adenopathy.  Skin:    Capillary Refill: Capillary refill takes less than 2 seconds.     Coloration: Skin is not jaundiced or pale.  Neurological:     General: No focal deficit present.     Mental Status: She is alert and oriented to person, place, and time.  Psychiatric:        Behavior: Behavior normal.      UC Treatments / Results  Labs (all labs ordered are listed, but only abnormal results are displayed) Labs Reviewed - No data to display  EKG   Radiology No results found.  Procedures Procedures (including critical care time)  Medications Ordered in UC Medications - No data to display  Initial Impression / Assessment and Plan / UC Course  I have reviewed the triage vital signs and the nursing notes.  Pertinent labs & imaging results that were available during my care of the patient were reviewed by me and considered in my medical decision making (see chart for details).  Zofran is sent in for the nausea and vomiting.  Bentyl sent in because she is cramping some also.  Final Clinical Impressions(s) / UC Diagnoses   Final diagnoses:   Gastroenteritis     Discharge Instructions      Ondansetron dissolved in the mouth every 8 hours as needed for nausea or vomiting. Clear liquids and bland things to eat. Avoid acidic foods like lemon/lime/orange/tomato.  Dicyclomine--take 1 every 6 hours as needed for intestinal cramps       ED Prescriptions     Medication Sig Dispense Auth. Provider   ondansetron (ZOFRAN-ODT) 4 MG disintegrating tablet  (Status: Discontinued) Take 1 tablet (4 mg total) by mouth every 8 (eight) hours as needed for nausea or vomiting. 10 tablet Barrett Henle, MD   ondansetron (ZOFRAN-ODT) 4 MG disintegrating tablet Take 1 tablet (4 mg total) by mouth every 8 (eight) hours as needed for nausea or vomiting. 10 tablet Barrett Henle, MD   dicyclomine (BENTYL) 20 MG tablet Take 1 tablet (20 mg total) by mouth 4 (four) times daily as needed (intestinal cramps). 20 tablet Treylin Burtch, Gwenlyn Perking, MD      PDMP not reviewed this encounter.   Barrett Henle, MD 11/06/22 [phone]

## 2023-02-08 ENCOUNTER — Encounter (HOSPITAL_COMMUNITY): Payer: Self-pay | Admitting: Emergency Medicine

## 2023-02-08 ENCOUNTER — Telehealth (HOSPITAL_COMMUNITY): Payer: Self-pay | Admitting: Emergency Medicine

## 2023-02-08 ENCOUNTER — Ambulatory Visit (HOSPITAL_COMMUNITY)
Admission: EM | Admit: 2023-02-08 | Discharge: 2023-02-08 | Disposition: A | Discharge status: Home / Self Care | Payer: Medicaid Other | Attending: Emergency Medicine | Admitting: Emergency Medicine

## 2023-02-08 DIAGNOSIS — J020 Streptococcal pharyngitis: Secondary | ICD-10-CM | POA: Diagnosis not present

## 2023-02-08 LAB — POCT RAPID STREP A (OFFICE): Rapid Strep A Screen: POSITIVE — AB

## 2023-02-08 MED ORDER — AMOXICILLIN 500 MG PO CAPS: 1000.0000 mg | ORAL_CAPSULE | Freq: Every day | ORAL | 0 refills | Status: AC

## 2023-02-08 MED ORDER — AMOXICILLIN 500 MG PO CAPS: 1000.0000 mg | ORAL_CAPSULE | Freq: Every day | ORAL | 0 refills | Status: DC

## 2023-02-08 NOTE — ED Provider Notes (Signed)
[facility]    CSN: [id number] Arrival date & time: 02/08/23  1714      History   Chief Complaint Chief Complaint  Patient presents with   Sore Throat    HPI Gabrielle Garcia is a 22 y.o. female.   Patient presents to clinic for complaints of sore throat for the past 2 days.  She has had hot and cold chills, unsure if she has had fever.  Reports she was recently exposed to someone who was sick, but they are feeling better now.  Denies any cough, abdominal pain, body aches, or other symptoms.  Reports pain and discomfort with eating and drinking, does have a fast food drink at bedside.  Able to speak in full sentences.    The history is provided by the patient and medical records.  Sore Throat Pertinent negatives include no chest pain and no shortness of breath.    Past Medical History:  Diagnosis Date   Asthma    Eczema    Hx of chlamydia infection 2020   Hx of trichomoniasis     Patient Active Problem List   Diagnosis Date Noted   Alpha thalassemia silent carrier 08/15/2021   Asthma    Atopic dermatitis 12/20/2012    Past Surgical History:  Procedure Laterality Date   WISDOM TOOTH EXTRACTION      OB History     Gravida  2   Para  1   Term  1   Preterm  0   AB  1   Living  1      SAB  1   IAB  0   Ectopic  0   Multiple  0   Live Births  1            Home Medications    Prior to Admission medications   Medication Sig Start Date End Date Taking? Authorizing Provider  amoxicillin (AMOXIL) 500 MG capsule Take 2 capsules (1,000 mg total) by mouth daily for 10 days. 02/08/23 02/18/23 Yes Rinaldo Ratel, [place] N, FNP  norgestimate-ethinyl estradiol (ORTHO-CYCLEN,SPRINTEC,PREVIFEM) 0.25-35 MG-MCG tablet Take 1 tablet by mouth daily. Patient not taking: Reported on 07/19/2018 02/08/18 01/14/20  Armando Reichert, CNM    Family History Family History  Problem Relation Age of Onset   Miscarriages / Stillbirths Mother     Hypertension Mother    Eczema Mother    Urticaria Mother    Hypertension Father    Asthma Brother    Hypertension Other    Diabetes Other    Allergic rhinitis Neg Hx    Angioedema Neg Hx    Atopy Neg Hx    Immunodeficiency Neg Hx     Social History Social History   Tobacco Use   Smoking status: Former    Types: Cigars   Smokeless tobacco: Never  Vaping Use   Vaping Use: Never used  Substance Use Topics   Alcohol use: No   Drug use: No     Allergies   Benadryl [diphenhydramine] and Ibuprofen   Review of Systems Review of Systems  Constitutional:  Positive for chills.  HENT:  Positive for sore throat, trouble swallowing and voice change.   Respiratory:  Negative for cough and shortness of breath.   Cardiovascular:  Negative for chest pain.     Physical Exam Triage Vital Signs ED Triage Vitals  Enc Vitals Group     BP 02/08/23 1725 124/78     Pulse Rate 02/08/23 1725 98  Resp 02/08/23 1725 16     Temp 02/08/23 1725 98.9 F (37.2 C)     Temp src --      SpO2 02/08/23 1725 98 %     Weight --      Height --      Head Circumference --      Peak Flow --      Pain Score 02/08/23 1724 8     Pain Loc --      Pain Edu? --      Excl. in [place]? --    No data found.  Updated Vital Signs BP 124/78 (BP Location: Right Arm)   Pulse 98   Temp 98.9 F (37.2 C)   Resp 16   LMP 02/02/2023   SpO2 98%   Visual Acuity Right Eye Distance:   Left Eye Distance:   Bilateral Distance:    Right Eye Near:   Left Eye Near:    Bilateral Near:     Physical Exam Vitals and nursing note reviewed.  Constitutional:      Appearance: Normal appearance. She is well-developed.  HENT:     Head: Normocephalic and atraumatic.     Right Ear: External ear normal.     Left Ear: External ear normal.     Nose: No congestion or rhinorrhea.     Mouth/Throat:     Mouth: Mucous membranes are moist.     Pharynx: Uvula midline. Posterior oropharyngeal erythema present.      Tonsils: Tonsillar exudate present. No tonsillar abscesses. 2+ on the right. 2+ on the left.  Eyes:     General: No scleral icterus. Cardiovascular:     Rate and Rhythm: Normal rate.  Pulmonary:     Effort: Pulmonary effort is normal. No respiratory distress.  Musculoskeletal:        General: Normal range of motion.  Lymphadenopathy:     Cervical: Cervical adenopathy present.  Skin:    General: Skin is warm and dry.  Neurological:     General: No focal deficit present.     Mental Status: She is alert and oriented to person, place, and time.  Psychiatric:        Mood and Affect: Mood normal.        Behavior: Behavior is cooperative.      UC Treatments / Results  Labs (all labs ordered are listed, but only abnormal results are displayed) Labs Reviewed  POCT RAPID STREP A (OFFICE) - Abnormal; Notable for the following components:      Result Value   Rapid Strep A Screen Positive (*)    All other components within normal limits    EKG   Radiology No results found.  Procedures Procedures (including critical care time)  Medications Ordered in UC Medications - No data to display  Initial Impression / Assessment and Plan / UC Course  I have reviewed the triage vital signs and the nursing notes.  Pertinent labs & imaging results that were available during my care of the patient were reviewed by me and considered in my medical decision making (see chart for details).  Vitals and triage reviewed, patient is hemodynamically stable.  Posterior pharynx with scattered exudate, tonsils with 2+ edema.  Uvula midline, low concern for peritonsillar abscess.  Able to control secretions and speak in full sentences.  Offered IM steroid injection for discomfort, patient declined.  Rapid strep positive in clinic, will send amoxicillin x 10 days.  Work note provided.  Plan of care,  follow-up care and return precautions given, no questions at this time.     Final Clinical Impressions(s) /  UC Diagnoses   Final diagnoses:  Strep pharyngitis     Discharge Instructions      Your strep test was positive.  Please take all antibiotics as prescribed and until finished, take them with food to help prevent gastrointestinal upset.  For pain and discomfort you can alternate between 500 mg of Tylenol and 800 mg of ibuprofen every 4-6 hours.  For your sore throat you can sleep with a humidifier, do warm saline gargles, tea with honey and over-the-counter lozenges.  You can also drink cold liquids and eat popsicles.  Please return to clinic if you find you are unable to swallow, have high fever despite medication, or no improvement despite antibiotics.     ED Prescriptions     Medication Sig Dispense Auth. Provider   amoxicillin (AMOXIL) 500 MG capsule Take 2 capsules (1,000 mg total) by mouth daily for 10 days. 20 capsule Ramiz Turpin, [place] N, [place]      PDMP not reviewed this encounter.   Wang [place], [place] N, [place] 02/08/23 1745

## 2023-02-08 NOTE — Discharge Instructions (Addendum)
Your strep test was positive.  Please take all antibiotics as prescribed and until finished, take them with food to help prevent gastrointestinal upset.  For pain and discomfort you can alternate between 500 mg of Tylenol and 800 mg of ibuprofen every 4-6 hours.  For your sore throat you can sleep with a humidifier, do warm saline gargles, tea with honey and over-the-counter lozenges.  You can also drink cold liquids and eat popsicles.  Please return to clinic if you find you are unable to swallow, have high fever despite medication, or no improvement despite antibiotics.

## 2023-02-08 NOTE — ED Triage Notes (Signed)
Pt c/o sore throat for several days

## 2023-08-04 ENCOUNTER — Encounter (HOSPITAL_COMMUNITY): Payer: Self-pay

## 2023-08-04 ENCOUNTER — Ambulatory Visit (HOSPITAL_COMMUNITY)
Admission: RE | Admit: 2023-08-04 | Discharge: 2023-08-04 | Disposition: A | Discharge status: Home / Self Care | Payer: Medicaid Other | Source: Ambulatory Visit | Attending: Internal Medicine | Admitting: Internal Medicine

## 2023-08-04 VITALS — BP 133/85 | HR 89 | Temp 98.3°F | Resp 16

## 2023-08-04 DIAGNOSIS — N946 Dysmenorrhea, unspecified: Secondary | ICD-10-CM | POA: Diagnosis not present

## 2023-08-04 DIAGNOSIS — Z113 Encounter for screening for infections with a predominantly sexual mode of transmission: Secondary | ICD-10-CM | POA: Insufficient documentation

## 2023-08-04 DIAGNOSIS — L309 Dermatitis, unspecified: Secondary | ICD-10-CM | POA: Diagnosis not present

## 2023-08-04 DIAGNOSIS — M67432 Ganglion, left wrist: Secondary | ICD-10-CM | POA: Diagnosis present

## 2023-08-04 DIAGNOSIS — M67431 Ganglion, right wrist: Secondary | ICD-10-CM | POA: Insufficient documentation

## 2023-08-04 MED ORDER — CLOBETASOL PROPIONATE 0.05 % EX CREA: 1.0000 | TOPICAL_CREAM | Freq: Two times a day (BID) | CUTANEOUS | 0 refills | Status: DC

## 2023-08-04 NOTE — Discharge Instructions (Addendum)
Follow up with your OB about your menstrual cramps.

## 2023-08-04 NOTE — ED Triage Notes (Signed)
Patient reports bilateral wrist pain for several months.  Patient states irregular menstrual cycle for several months. Patient has the IUD. Patient request STD-testing.

## 2023-08-04 NOTE — ED Provider Notes (Signed)
 [facility]    CSN: [id number] Arrival date & time: 08/04/23  1518      History   Chief Complaint Chief Complaint  Patient presents with   Wrist Pain   Exposure to STD   Vaginal Bleeding    HPI Gabrielle Garcia is a 22 y.o. female who presents with multiple complaints 1-  bilateral dorsal wrist pain and lumps since a child off and on.  Never had broken bone. She never seeked care. In the past 5 months they are causing pain. She does not have a PCP  2- Has been getting nausea and worse period cramps that radiate to her thighs x 5 months. Has an IUD in place. She has mentioned to her OB doctor, but does not recall what the recommendations were.   3- Would like STD testing just to check into this. Does not have a new sexual partner. Denies abnormal vaginal discharge.    4- Needs refill of eczema  and does not have a PCP. Due to working at [employer], she gets her hands wet a lot and they are flaring on her hands.      Past Medical History:  Diagnosis Date   Asthma    Eczema    Hx of chlamydia infection 2020   Hx of trichomoniasis     Patient Active Problem List   Diagnosis Date Noted   Alpha thalassemia silent carrier 08/15/2021   Asthma    Atopic dermatitis 12/20/2012    Past Surgical History:  Procedure Laterality Date   WISDOM TOOTH EXTRACTION      OB History     Gravida  2   Para  1   Term  1   Preterm  0   AB  1   Living  1      SAB  1   IAB  0   Ectopic  0   Multiple  0   Live Births  1            Home Medications    Prior to Admission medications   Medication Sig Start Date End Date Taking? Authorizing Provider  clobetasol  cream (TEMOVATE ) 0.05 % Apply 1 Application topically 2 (two) times daily. 08/04/23  Yes Rodriguez-Southworth, Kebron Pulse, PA-C  norgestimate -ethinyl estradiol  (ORTHO-CYCLEN,SPRINTEC,PREVIFEM) 0.25-35 MG-MCG tablet Take 1 tablet by mouth daily. Patient not taking: Reported on 07/19/2018  02/08/18 01/14/20  Edmundo Powell BIRCH, CNM    Family History Family History  Problem Relation Age of Onset   Miscarriages / Stillbirths Mother    Hypertension Mother    Eczema Mother    Urticaria Mother    Hypertension Father    Asthma Brother    Hypertension Other    Diabetes Other    Allergic rhinitis Neg Hx    Angioedema Neg Hx    Atopy Neg Hx    Immunodeficiency Neg Hx     Social History Social History   Tobacco Use   Smoking status: Former    Types: Cigars   Smokeless tobacco: Never  Vaping Use   Vaping status: Never Used  Substance Use Topics   Alcohol use: No   Drug use: No     Allergies   Benadryl [diphenhydramine] and Ibuprofen    Review of Systems Review of Systems  As noted in HIP Physical Exam Triage Vital Signs ED Triage Vitals [08/04/23 1543]  Encounter Vitals Group     BP 133/85     Systolic BP Percentile  Diastolic BP Percentile      Pulse Rate 89     Resp 16     Temp 98.3 F (36.8 C)     Temp Source Oral     SpO2 98 %     Weight      Height      Head Circumference      Peak Flow      Pain Score      Pain Loc      Pain Education      Exclude from Growth Chart    No data found.  Updated Vital Signs BP 133/85 (BP Location: Left Arm)   Pulse 89   Temp 98.3 F (36.8 C) (Oral)   Resp 16   LMP 08/04/2023   SpO2 98%   Visual Acuity Right Eye Distance:   Left Eye Distance:   Bilateral Distance:    Right Eye Near:   Left Eye Near:    Bilateral Near:     Physical Exam Vitals reviewed.  Constitutional:      General: She is not in acute distress.    Appearance: She is normal weight. She is not toxic-appearing.  HENT:     Right Ear: External ear normal.     Left Ear: External ear normal.  Eyes:     General: No scleral icterus.    Conjunctiva/sclera: Conjunctivae normal.  Pulmonary:     Effort: Pulmonary effort is normal.  Abdominal:     General: Bowel sounds are normal.     Palpations: Abdomen is soft. There is no  mass.     Tenderness: There is no abdominal tenderness. There is no right CVA tenderness or left CVA tenderness.  Musculoskeletal:     Cervical back: Neck supple.     Comments: WRISTS- has ganglion cyst on both central dorsal wrists. ROM is normal.   Skin:    General: Skin is warm and dry.     Findings: Rash present. No bruising, erythema or lesion.     Comments: Has dry patches on dorsal thumb region  Neurological:     Mental Status: She is alert and oriented to person, place, and time.     Gait: Gait normal.  Psychiatric:        Mood and Affect: Mood normal.        Behavior: Behavior normal.        Thought Content: Thought content normal.        Judgment: Judgment normal.      UC Treatments / Results  Labs (all labs ordered are listed, but only abnormal results are displayed) Labs Reviewed  CERVICOVAGINAL ANCILLARY ONLY    EKG   Radiology No results found.  Procedures Procedures (including critical care time)  Medications Ordered in UC Medications - No data to display  Initial Impression / Assessment and Plan / UC Course  I have reviewed the triage vital signs and the nursing notes.  Dysmenorrhea Bilateral ganglion cyst of wrists Eczema STD screen   I refilled her temovate  as noted Advised to see ortho for her wrist at [facility] We will call her if the STD test are positive Needs to F U with OB about dysmenorrhea.      Final Clinical Impressions(s) / UC Diagnoses   Final diagnoses:  Ganglion cyst of both wrists  Dysmenorrhea  Screen for STD (sexually transmitted disease)     Discharge Instructions      Follow up with your OB about your menstrual cramps.  ED Prescriptions     Medication Sig Dispense Auth. Provider   clobetasol  cream (TEMOVATE ) 0.05 % Apply 1 Application topically 2 (two) times daily. 60 g Rodriguez-Southworth, Kyra, PA-C      PDMP not reviewed this encounter.   Lindi Kyra, [place] 08/04/23  [id number]

## 2023-08-06 LAB — CERVICOVAGINAL ANCILLARY ONLY
Bacterial Vaginitis (gardnerella): POSITIVE — AB
Candida Glabrata: NEGATIVE
Candida Vaginitis: NEGATIVE
Chlamydia: NEGATIVE
Comment: NEGATIVE
Comment: NEGATIVE
Comment: NEGATIVE
Comment: NEGATIVE
Comment: NEGATIVE
Comment: NORMAL
Neisseria Gonorrhea: NEGATIVE
Trichomonas: NEGATIVE

## 2023-08-07 ENCOUNTER — Telehealth (HOSPITAL_COMMUNITY): Payer: Self-pay

## 2023-08-07 MED ORDER — METRONIDAZOLE 500 MG PO TABS: 500.0000 mg | ORAL_TABLET | Freq: Two times a day (BID) | ORAL | 0 refills | Status: AC

## 2023-08-07 NOTE — Telephone Encounter (Signed)
 Metronidazole sent in to treat the BV

## 2023-08-07 NOTE — Telephone Encounter (Signed)
 Patient was positive for BV. Please advise

## 2023-10-16 ENCOUNTER — Ambulatory Visit: Payer: Medicaid Other | Admitting: Obstetrics and Gynecology

## 2023-11-18 ENCOUNTER — Ambulatory Visit (HOSPITAL_COMMUNITY)
Admission: EM | Admit: 2023-11-18 | Discharge: 2023-11-18 | Disposition: A | Discharge status: Home / Self Care | Attending: Emergency Medicine | Admitting: Emergency Medicine

## 2023-11-18 ENCOUNTER — Encounter (HOSPITAL_COMMUNITY): Payer: Self-pay

## 2023-11-18 DIAGNOSIS — N760 Acute vaginitis: Secondary | ICD-10-CM | POA: Insufficient documentation

## 2023-11-18 DIAGNOSIS — B9689 Other specified bacterial agents as the cause of diseases classified elsewhere: Secondary | ICD-10-CM | POA: Diagnosis not present

## 2023-11-18 DIAGNOSIS — N898 Other specified noninflammatory disorders of vagina: Secondary | ICD-10-CM | POA: Insufficient documentation

## 2023-11-18 MED ORDER — METRONIDAZOLE 500 MG PO TABS: 500.0000 mg | ORAL_TABLET | Freq: Two times a day (BID) | ORAL | 0 refills | Status: AC

## 2023-11-18 NOTE — ED Provider Notes (Signed)
 [facility]    CSN: [id number] Arrival date & time: 11/18/23  1822      History   Chief Complaint Chief Complaint  Patient presents with   Vaginal Discharge    HPI Gabrielle Garcia is a 23 y.o. female.   Patient presents with vaginal discharge x 2 weeks.  Patient states that she gets recurrent BV.  Patient states that occasionally after her menstrual cycle she will begin to have increased vaginal discharge and ultimately be diagnosed with BV.  Patient states she has not been sexually active for 3 months.  LMP 11/03/2023.  Denies abnormal vaginal bleeding, abdominal pain, flank pain, dysuria, urinary urgency, hematuria, and fever.   Vaginal Discharge   Past Medical History:  Diagnosis Date   Asthma    Eczema    Hx of chlamydia infection 2020   Hx of trichomoniasis     Patient Active Problem List   Diagnosis Date Noted   Alpha thalassemia silent carrier 08/15/2021   Asthma    Atopic dermatitis 12/20/2012    Past Surgical History:  Procedure Laterality Date   WISDOM TOOTH EXTRACTION      OB History     Gravida  2   Para  1   Term  1   Preterm  0   AB  1   Living  1      SAB  1   IAB  0   Ectopic  0   Multiple  0   Live Births  1            Home Medications    Prior to Admission medications   Medication Sig Start Date End Date Taking? Authorizing Provider  metroNIDAZOLE (FLAGYL) 500 MG tablet Take 1 tablet (500 mg total) by mouth 2 (two) times daily. 11/18/23  Yes Wynonia Lawman A, NP  norgestimate-ethinyl estradiol (ORTHO-CYCLEN,SPRINTEC,PREVIFEM) 0.25-35 MG-MCG tablet Take 1 tablet by mouth daily. Patient not taking: Reported on 07/19/2018 02/08/18 01/14/20  Armando Reichert, CNM    Family History Family History  Problem Relation Age of Onset   Miscarriages / Stillbirths Mother    Hypertension Mother    Eczema Mother    Urticaria Mother    Hypertension Father    Asthma Brother    Hypertension Other     Diabetes Other    Allergic rhinitis Neg Hx    Angioedema Neg Hx    Atopy Neg Hx    Immunodeficiency Neg Hx     Social History Social History   Tobacco Use   Smoking status: Former    Types: Cigars   Smokeless tobacco: Never  Vaping Use   Vaping status: Never Used  Substance Use Topics   Alcohol use: No   Drug use: No     Allergies   Benadryl [diphenhydramine] and Ibuprofen   Review of Systems Review of Systems  Genitourinary:  Positive for vaginal discharge.   Per HPI  Physical Exam Triage Vital Signs ED Triage Vitals  Encounter Vitals Group     BP 11/18/23 1934 118/76     Systolic BP Percentile --      Diastolic BP Percentile --      Pulse Rate 11/18/23 1934 98     Resp 11/18/23 1934 18     Temp 11/18/23 1934 98 F (36.7 C)     Temp Source 11/18/23 1934 Oral     SpO2 11/18/23 1934 98 %     Weight --  Height --      Head Circumference --      Peak Flow --      Pain Score 11/18/23 1935 0     Pain Loc --      Pain Education --      Exclude from Growth Chart --    No data found.  Updated Vital Signs BP 118/76 (BP Location: Left Arm)   Pulse 98   Temp 98 F (36.7 C) (Oral)   Resp 18   LMP 11/03/2023 (Approximate)   SpO2 98%   Visual Acuity Right Eye Distance:   Left Eye Distance:   Bilateral Distance:    Right Eye Near:   Left Eye Near:    Bilateral Near:     Physical Exam Vitals and nursing note reviewed.  Constitutional:      General: She is awake. She is not in acute distress.    Appearance: Normal appearance. She is well-developed and well-groomed. She is not ill-appearing.  Genitourinary:    Comments: Exam deferred Skin:    General: Skin is warm and dry.  Neurological:     Mental Status: She is alert.  Psychiatric:        Behavior: Behavior is cooperative.      UC Treatments / Results  Labs (all labs ordered are listed, but only abnormal results are displayed) Labs Reviewed  CERVICOVAGINAL ANCILLARY ONLY     EKG   Radiology No results found.  Procedures Procedures (including critical care time)  Medications Ordered in UC Medications - No data to display  Initial Impression / Assessment and Plan / UC Course  I have reviewed the triage vital signs and the nursing notes.  Pertinent labs & imaging results that were available during my care of the patient were reviewed by me and considered in my medical decision making (see chart for details).     Patient is well-appearing.  Vitals are stable.  GU exam deferred, patient performed self swab for BV/yeast.  Empirically treating with metronidazole for BV.  Discussed follow-up and return precautions. Final Clinical Impressions(s) / UC Diagnoses   Final diagnoses:  BV (bacterial vaginosis)  Vaginal discharge     Discharge Instructions      Start taking metronidazole twice daily for 7 days. Your results will come back over the next few days and someone will call if we need to adjust your treatment. Follow-up with OB/GYN for recurrent symptoms. Return here as needed.    ED Prescriptions     Medication Sig Dispense Auth. Provider   metroNIDAZOLE (FLAGYL) 500 MG tablet Take 1 tablet (500 mg total) by mouth 2 (two) times daily. 14 tablet Levora Reas A, NP      PDMP not reviewed this encounter.   Levora Reas A, NP 11/18/23 2021

## 2023-11-18 NOTE — Discharge Instructions (Signed)
 Start taking metronidazole twice daily for 7 days. Your results will come back over the next few days and someone will call if we need to adjust your treatment. Follow-up with OB/GYN for recurrent symptoms. Return here as needed.

## 2023-11-18 NOTE — ED Triage Notes (Signed)
 Vaginal discharge- patient states its BV. This is a recurrent issue for patient.

## 2023-11-19 ENCOUNTER — Telehealth (HOSPITAL_COMMUNITY): Payer: Self-pay

## 2023-11-19 LAB — CERVICOVAGINAL ANCILLARY ONLY
Bacterial Vaginitis (gardnerella): POSITIVE — AB
Candida Glabrata: NEGATIVE
Candida Vaginitis: NEGATIVE
Comment: NEGATIVE
Comment: NEGATIVE
Comment: NEGATIVE

## 2023-11-19 NOTE — Telephone Encounter (Signed)
 Created in error

## 2024-02-25 ENCOUNTER — Encounter (HOSPITAL_COMMUNITY): Payer: Self-pay | Admitting: *Deleted

## 2024-02-25 ENCOUNTER — Ambulatory Visit (HOSPITAL_COMMUNITY)
Admission: EM | Admit: 2024-02-25 | Discharge: 2024-02-25 | Disposition: A | Discharge status: Home / Self Care | Attending: Emergency Medicine | Admitting: Emergency Medicine

## 2024-02-25 DIAGNOSIS — J029 Acute pharyngitis, unspecified: Secondary | ICD-10-CM | POA: Insufficient documentation

## 2024-02-25 DIAGNOSIS — Z113 Encounter for screening for infections with a predominantly sexual mode of transmission: Secondary | ICD-10-CM | POA: Insufficient documentation

## 2024-02-25 LAB — POCT RAPID STREP A (OFFICE): Rapid Strep A Screen: NEGATIVE

## 2024-02-25 NOTE — ED Triage Notes (Signed)
 Pt states she has had sore throat, red and oozing some stuff since 01/21/2024. She states that she has had oral sex. She also states she is having vaginal itching and her vagina was swollen. Pt states she would like only cyto today.

## 2024-02-25 NOTE — Discharge Instructions (Signed)
 Your rapid strep test was negative today.  As discussed I believe your sore throat is likely related to a viral pharyngitis.  We have done some STD testing today and these results will return over the next few days and someone will call if results are positive and require treatment. Otherwise you can alternate between 650 mg of Tylenol  and 400 mg of ibuprofen  every 6-8 hours as needed for sore throat. Make sure you are staying hydrated and getting plenty of rest. Follow-up with your primary care provider or return here as needed.

## 2024-02-25 NOTE — ED Provider Notes (Signed)
 [facility]    CSN: [id number] Arrival date & time: 02/25/24  1924      History   Chief Complaint Chief Complaint  Patient presents with   Sore Throat   SEXUALLY TRANSMITTED DISEASE    HPI Gabrielle Garcia is a 23 y.o. female.   Patient presents with sore throat since 6/19.  Patient states that she had some redness, tonsillar swelling, and oozing from her tonsils on 6/19.  Patient states that the redness, swelling, and oozing has resolved but she continues to have a sore throat.  Denies fever, body aches, chills.  Patient also does report some vaginal irritation, itching, and discharge over the last few weeks as well.  Denies abnormal vaginal bleeding, vaginal pain, vaginal lesions, dysuria, hematuria, urinary frequency/urgency, abdominal pain, flank pain, nausea, and vomiting.    Patient does report recent unprotected sexual intercourse that also included oral intercourse and she is requesting oral and vaginal STD testing at this time.  Patient denies any known exposures to STDs.  LMP 7/22.  The history is provided by the patient and medical records.  Sore Throat    Past Medical History:  Diagnosis Date   Asthma    Eczema    Hx of chlamydia infection 2020   Hx of trichomoniasis     Patient Active Problem List   Diagnosis Date Noted   Alpha thalassemia silent carrier 08/15/2021   Asthma    Atopic dermatitis 12/20/2012    Past Surgical History:  Procedure Laterality Date   WISDOM TOOTH EXTRACTION      OB History     Gravida  2   Para  1   Term  1   Preterm  0   AB  1   Living  1      SAB  1   IAB  0   Ectopic  0   Multiple  0   Live Births  1            Home Medications    Prior to Admission medications   Medication Sig Start Date End Date Taking? Authorizing Provider  metroNIDAZOLE  (FLAGYL ) 500 MG tablet Take 1 tablet (500 mg total) by mouth 2 (two) times daily. 11/18/23   Johnie Rumaldo LABOR, NP   norgestimate -ethinyl estradiol  (ORTHO-CYCLEN,SPRINTEC,PREVIFEM) 0.25-35 MG-MCG tablet Take 1 tablet by mouth daily. Patient not taking: Reported on 07/19/2018 02/08/18 01/14/20  Edmundo Powell BIRCH, CNM    Family History Family History  Problem Relation Age of Onset   Miscarriages / Stillbirths Mother    Hypertension Mother    Eczema Mother    Urticaria Mother    Hypertension Father    Asthma Brother    Hypertension Other    Diabetes Other    Allergic rhinitis Neg Hx    Angioedema Neg Hx    Atopy Neg Hx    Immunodeficiency Neg Hx     Social History Social History   Tobacco Use   Smoking status: Former    Types: Cigars   Smokeless tobacco: Never  Vaping Use   Vaping status: Never Used  Substance Use Topics   Alcohol use: No   Drug use: No     Allergies   Benadryl [diphenhydramine] and Ibuprofen    Review of Systems Review of Systems  Per HPI  Physical Exam Triage Vital Signs ED Triage Vitals  Encounter Vitals Group     BP 02/25/24 1952 125/78     Girls Systolic BP Percentile --  Girls Diastolic BP Percentile --      Boys Systolic BP Percentile --      Boys Diastolic BP Percentile --      Pulse Rate 02/25/24 1952 84     Resp 02/25/24 1952 16     Temp 02/25/24 1952 98.5 F (36.9 C)     Temp Source 02/25/24 1952 Oral     SpO2 02/25/24 1952 98 %     Weight --      Height --      Head Circumference --      Peak Flow --      Pain Score 02/25/24 1951 0     Pain Loc --      Pain Education --      Exclude from Growth Chart --    No data found.  Updated Vital Signs BP 125/78 (BP Location: Left Arm)   Pulse 84   Temp 98.5 F (36.9 C) (Oral)   Resp 16   LMP 02/23/2024 (Exact Date)   SpO2 98%   Visual Acuity Right Eye Distance:   Left Eye Distance:   Bilateral Distance:    Right Eye Near:   Left Eye Near:    Bilateral Near:     Physical Exam Vitals and nursing note reviewed.  Constitutional:      General: She is awake. She is not in acute  distress.    Appearance: Normal appearance. She is well-developed and well-groomed. She is not ill-appearing.  HENT:     Right Ear: Tympanic membrane, ear canal and external ear normal.     Left Ear: Tympanic membrane, ear canal and external ear normal.     Nose: Nose normal.     Mouth/Throat:     Mouth: Mucous membranes are moist.     Pharynx: Posterior oropharyngeal erythema present. No oropharyngeal exudate.     Tonsils: No tonsillar exudate.  Genitourinary:    Comments: Exam deferred Skin:    General: Skin is warm and dry.  Neurological:     Mental Status: She is alert.  Psychiatric:        Behavior: Behavior is cooperative.      UC Treatments / Results  Labs (all labs ordered are listed, but only abnormal results are displayed) Labs Reviewed  POCT RAPID STREP A (OFFICE)  CERVICOVAGINAL ANCILLARY ONLY  CYTOLOGY, (ORAL, ANAL, URETHRAL) ANCILLARY ONLY    EKG   Radiology No results found.  Procedures Procedures (including critical care time)  Medications Ordered in UC Medications - No data to display  Initial Impression / Assessment and Plan / UC Course  I have reviewed the triage vital signs and the nursing notes.  Pertinent labs & imaging results that were available during my care of the patient were reviewed by me and considered in my medical decision making (see chart for details).     Patient is overall well-appearing.  Vitals are stable.  Mild erythema noted to pharynx without tonsillar swelling or exudate.  No other significant findings upon exam.  GU exam deferred.  Patient performed self swab for STD/STI.  Patient also did perform oral cytology swab per patient request.  Rapid strep swab was performed by myself and this was negative in clinic.  Deferred sending culture and treating empirically for strep pharyngitis due to exam findings being inconsistent with this.  Discussed symptoms likely viral in nature.  Recommended Tylenol  and ibuprofen  as needed  for pain.  Discussed follow-up and return precautions. Final Clinical Impressions(s) / UC  Diagnoses   Final diagnoses:  Viral pharyngitis  Sore throat  Screening for STD (sexually transmitted disease)     Discharge Instructions      Your rapid strep test was negative today.  As discussed I believe your sore throat is likely related to a viral pharyngitis.  We have done some STD testing today and these results will return over the next few days and someone will call if results are positive and require treatment. Otherwise you can alternate between 650 mg of Tylenol  and 400 mg of ibuprofen  every 6-8 hours as needed for sore throat. Make sure you are staying hydrated and getting plenty of rest. Follow-up with your primary care provider or return here as needed.   ED Prescriptions   None    PDMP not reviewed this encounter.   Johnie Flaming A, NP 02/25/24 2027

## 2024-02-26 LAB — CYTOLOGY, (ORAL, ANAL, URETHRAL) ANCILLARY ONLY
Chlamydia: NEGATIVE
Comment: NEGATIVE
Comment: NORMAL
Neisseria Gonorrhea: NEGATIVE

## 2024-02-29 ENCOUNTER — Ambulatory Visit (HOSPITAL_COMMUNITY): Payer: Self-pay

## 2024-02-29 LAB — CERVICOVAGINAL ANCILLARY ONLY
Bacterial Vaginitis (gardnerella): POSITIVE — AB
Candida Glabrata: NEGATIVE
Candida Vaginitis: NEGATIVE
Chlamydia: NEGATIVE
Comment: NEGATIVE
Comment: NEGATIVE
Comment: NEGATIVE
Comment: NEGATIVE
Comment: NEGATIVE
Comment: NORMAL
Neisseria Gonorrhea: NEGATIVE
Trichomonas: NEGATIVE

## 2024-02-29 MED ORDER — METRONIDAZOLE 500 MG PO TABS: 500.0000 mg | ORAL_TABLET | Freq: Two times a day (BID) | ORAL | 0 refills | Status: AC

## 2024-03-21 ENCOUNTER — Ambulatory Visit: Admitting: Obstetrics and Gynecology

## 2024-04-19 ENCOUNTER — Ambulatory Visit (HOSPITAL_COMMUNITY)
Admission: EM | Admit: 2024-04-19 | Discharge: 2024-04-19 | Disposition: A | Discharge status: Home / Self Care | Attending: Emergency Medicine | Admitting: Emergency Medicine

## 2024-04-19 ENCOUNTER — Encounter (HOSPITAL_COMMUNITY): Payer: Self-pay

## 2024-04-19 DIAGNOSIS — J069 Acute upper respiratory infection, unspecified: Secondary | ICD-10-CM

## 2024-04-19 LAB — POC COVID19/FLU A&B COMBO
Covid Antigen, POC: NEGATIVE
Influenza A Antigen, POC: NEGATIVE
Influenza B Antigen, POC: NEGATIVE

## 2024-04-19 NOTE — ED Triage Notes (Signed)
 Patient presents to the office for sore throat,chills, and fatigue that started yesterday.

## 2024-04-19 NOTE — Discharge Instructions (Addendum)
 Your covid and flu test are negative. Most likely you have a viral illness: no antibiotic as indicated at this time, May treat with OTC meds of choice. Make sure to drink plenty of fluids to stay hydrated(gatorade, water, popsicles,jello,etc), avoid caffeine products. Follow up with PCP. Return as needed.

## 2024-04-19 NOTE — ED Provider Notes (Signed)
 [facility]    CSN: [id number] Arrival date & time: 04/19/24  1200      History   Chief Complaint Chief Complaint  Patient presents with   Chills   Cough   Fatigue    HPI Gabrielle Garcia is a 23 y.o. female.   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 No language interpreter was used.    Past Medical History:  Diagnosis Date   Asthma    Eczema    Hx of chlamydia infection 2020   Hx of trichomoniasis     Patient Active Problem List   Diagnosis Date Noted   Viral URI 04/19/2024   Alpha thalassemia silent carrier 08/15/2021   Asthma    Atopic dermatitis 12/20/2012    Past Surgical History:  Procedure Laterality Date   WISDOM TOOTH EXTRACTION      OB History     Gravida  2   Para  1   Term  1   Preterm  0   AB  1   Living  1      SAB  1   IAB  0   Ectopic  0   Multiple  0   Live Births  1            Home Medications    Prior to Admission medications   Medication Sig Start Date End Date Taking? Authorizing Provider  metroNIDAZOLE  (FLAGYL ) 500 MG tablet Take 1 tablet (500 mg total) by mouth 2 (two) times daily. 11/18/23   Johnie Rumaldo LABOR, NP  norgestimate -ethinyl estradiol  (ORTHO-CYCLEN,SPRINTEC,PREVIFEM) 0.25-35 MG-MCG tablet Take 1 tablet by mouth daily. Patient not taking: Reported on 07/19/2018 02/08/18 01/14/20  Edmundo Powell BIRCH, CNM    Family History Family History  Problem Relation Age of Onset   Miscarriages / Stillbirths Mother    Hypertension Mother    Eczema Mother    Urticaria Mother    Hypertension Father    Asthma Brother    Hypertension Other    Diabetes Other    Allergic rhinitis Neg Hx    Angioedema Neg Hx    Atopy Neg Hx    Immunodeficiency Neg Hx      Social History Social History   Tobacco Use   Smoking status: Former    Types: Cigars   Smokeless tobacco: Never  Vaping Use   Vaping status: Never Used  Substance Use Topics   Alcohol use: No   Drug use: No     Allergies   Benadryl [diphenhydramine] and Ibuprofen    Review of Systems Review of Systems  Constitutional:  Positive for chills and fatigue.  HENT:  Positive for sore throat.   Respiratory:  Negative for cough.   Musculoskeletal:  Positive for myalgias.  Neurological:  Positive for headaches.  All other systems reviewed and are negative.    Physical Exam Triage Vital Signs ED Triage Vitals  Encounter Vitals Group     BP 04/19/24 1226 119/74     Girls Systolic BP Percentile --      Girls Diastolic BP Percentile --      Boys Systolic BP Percentile --      Boys Diastolic BP Percentile --      Pulse Rate 04/19/24 1226 82  Resp 04/19/24 1226 18     Temp 04/19/24 1226 98.2 F (36.8 C)     Temp Source 04/19/24 1226 Oral     SpO2 04/19/24 1226 98 %     Weight --      Height --      Head Circumference --      Peak Flow --      Pain Score 04/19/24 1227 4     Pain Loc --      Pain Education --      Exclude from Growth Chart --    No data found.  Updated Vital Signs BP 119/74 (BP Location: Left Arm)   Pulse 82   Temp 98.2 F (36.8 C) (Oral)   Resp 18   LMP 04/19/2024 (Approximate)   SpO2 98%   Visual Acuity Right Eye Distance:   Left Eye Distance:   Bilateral Distance:    Right Eye Near:   Left Eye Near:    Bilateral Near:     Physical Exam Vitals and nursing note reviewed.  Constitutional:      General: She is not in acute distress.    Appearance: She is well-developed and well-groomed.  HENT:     Head: Normocephalic.     Right Ear: Tympanic membrane is retracted.     Left Ear: Tympanic membrane is retracted.     Nose: Congestion present.     Mouth/Throat:     Lips: Pink.     Mouth: Mucous membranes are moist.     Pharynx:  Oropharynx is clear. Uvula midline. No oropharyngeal exudate or posterior oropharyngeal erythema.     Tonsils: No tonsillar exudate or tonsillar abscesses.  Eyes:     General: Lids are normal.     Conjunctiva/sclera: Conjunctivae normal.     Pupils: Pupils are equal, round, and reactive to light.  Neck:     Trachea: No tracheal deviation.  Cardiovascular:     Rate and Rhythm: Normal rate and regular rhythm.     Heart sounds: Normal heart sounds. No murmur heard. Pulmonary:     Effort: Pulmonary effort is normal.     Breath sounds: Normal breath sounds and air entry.  Abdominal:     General: Bowel sounds are normal.     Palpations: Abdomen is soft.     Tenderness: There is no abdominal tenderness.  Musculoskeletal:        General: Normal range of motion.     Cervical back: Normal range of motion.  Lymphadenopathy:     Cervical: No cervical adenopathy.  Skin:    General: Skin is warm and dry.     Findings: No rash.  Neurological:     General: No focal deficit present.     Mental Status: She is alert and oriented to person, place, and time.     GCS: GCS eye subscore is 4. GCS verbal subscore is 5. GCS motor subscore is 6.  Psychiatric:        Attention and Perception: Attention normal.        Mood and Affect: Mood normal.        Speech: Speech normal.        Behavior: Behavior normal. Behavior is cooperative.      UC Treatments / Results  Labs (all labs ordered are listed, but only abnormal results are displayed) Labs Reviewed  POC COVID19/FLU A&B COMBO    EKG   Radiology No results found.  Procedures Procedures (including critical care time)  Medications Ordered in UC Medications - No data to display  Initial Impression / Assessment and Plan / UC Course  I have reviewed the triage vital signs and the nursing notes.  Pertinent labs & imaging results that were available during my care of the patient were reviewed by me and considered in my medical decision  making (see chart for details).    Discussed exam findings and plan of care with patient, covid and flu test are negative, strict go to ER precautions given.   Patient verbalized understanding to this provider.  Ddx: Viral URI,allergies Final Clinical Impressions(s) / UC Diagnoses   Final diagnoses:  Viral URI     Discharge Instructions      Your covid and flu test are negative. Most likely you have a viral illness: no antibiotic as indicated at this time, May treat with OTC meds of choice. Make sure to drink plenty of fluids to stay hydrated(gatorade, water, popsicles,jello,etc), avoid caffeine products. Follow up with PCP. Return as needed.     ED Prescriptions   None    PDMP not reviewed this encounter.   Aminta Loose, NP 04/19/24 1656
